# Patient Record
Sex: Female | Born: 1990 | Race: White | Hispanic: No | Marital: Single | Smoking: Never smoker
Health system: Southern US, Community
[De-identification: ages and names within clinical notes are randomized; demographics above are authoritative.]

## PROBLEM LIST (undated history)

## (undated) ENCOUNTER — Inpatient Hospital Stay: Payer: Self-pay

## (undated) DIAGNOSIS — N83209 Unspecified ovarian cyst, unspecified side: Secondary | ICD-10-CM

## (undated) DIAGNOSIS — F39 Unspecified mood [affective] disorder: Secondary | ICD-10-CM

## (undated) HISTORY — PX: TONSILLECTOMY: SUR1361

## (undated) HISTORY — PX: BREAST BIOPSY: SHX20

---
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
## 2008-07-28 ENCOUNTER — Emergency Department: Payer: Self-pay | Admitting: Emergency Medicine

## 2008-09-24 ENCOUNTER — Ambulatory Visit: Payer: Self-pay | Admitting: Otolaryngology

## 2008-09-30 ENCOUNTER — Ambulatory Visit: Payer: Self-pay | Admitting: Otolaryngology

## 2009-03-29 ENCOUNTER — Ambulatory Visit: Payer: Self-pay | Admitting: Family Medicine

## 2009-04-02 ENCOUNTER — Emergency Department: Payer: Self-pay | Admitting: Emergency Medicine

## 2009-06-04 ENCOUNTER — Emergency Department: Payer: Self-pay | Admitting: Internal Medicine

## 2009-09-07 ENCOUNTER — Emergency Department: Payer: Self-pay | Admitting: Emergency Medicine

## 2010-03-05 ENCOUNTER — Emergency Department: Payer: Self-pay | Admitting: Emergency Medicine

## 2010-08-02 ENCOUNTER — Emergency Department: Payer: Self-pay | Admitting: Emergency Medicine

## 2010-10-22 ENCOUNTER — Observation Stay: Payer: Self-pay | Admitting: Obstetrics and Gynecology

## 2010-11-01 ENCOUNTER — Observation Stay: Payer: Self-pay

## 2010-12-02 ENCOUNTER — Observation Stay: Payer: Self-pay | Admitting: Obstetrics & Gynecology

## 2010-12-27 ENCOUNTER — Inpatient Hospital Stay: Payer: Self-pay

## 2011-07-23 ENCOUNTER — Emergency Department: Payer: Self-pay | Admitting: Emergency Medicine

## 2011-07-23 LAB — URINALYSIS, COMPLETE
Glucose,UR: NEGATIVE mg/dL (ref 0–75)
Leukocyte Esterase: NEGATIVE
Nitrite: NEGATIVE
Protein: 30
Specific Gravity: 1.029 (ref 1.003–1.030)
WBC UR: 8 /HPF (ref 0–5)

## 2011-07-23 LAB — PREGNANCY, URINE: Pregnancy Test, Urine: NEGATIVE m[IU]/mL

## 2011-10-11 ENCOUNTER — Emergency Department: Payer: Self-pay

## 2012-01-29 ENCOUNTER — Emergency Department: Payer: Self-pay | Admitting: *Deleted

## 2012-01-29 LAB — CBC
HCT: 40.7 % (ref 35.0–47.0)
HGB: 14.4 g/dL (ref 12.0–16.0)
MCH: 28.9 pg (ref 26.0–34.0)
MCHC: 35.3 g/dL (ref 32.0–36.0)
MCV: 82 fL (ref 80–100)
RBC: 4.96 10*6/uL (ref 3.80–5.20)
RDW: 12.7 % (ref 11.5–14.5)

## 2012-01-29 LAB — PREGNANCY, URINE: Pregnancy Test, Urine: NEGATIVE m[IU]/mL

## 2012-05-21 ENCOUNTER — Emergency Department: Payer: Self-pay | Admitting: Unknown Physician Specialty

## 2012-08-14 ENCOUNTER — Emergency Department: Payer: Self-pay | Admitting: Internal Medicine

## 2012-09-06 ENCOUNTER — Emergency Department: Payer: Self-pay | Admitting: Emergency Medicine

## 2012-09-06 LAB — CBC
MCV: 83 fL (ref 80–100)
WBC: 9.1 10*3/uL (ref 3.6–11.0)

## 2012-09-06 LAB — COMPREHENSIVE METABOLIC PANEL
Alkaline Phosphatase: 39 U/L — ABNORMAL LOW (ref 50–136)
BUN: 10 mg/dL (ref 7–18)
Bilirubin,Total: 0.3 mg/dL (ref 0.2–1.0)
Calcium, Total: 8.9 mg/dL (ref 8.5–10.1)
Creatinine: 0.63 mg/dL (ref 0.60–1.30)
Glucose: 73 mg/dL (ref 65–99)
Osmolality: 277 (ref 275–301)
Sodium: 140 mmol/L (ref 136–145)
Total Protein: 7.1 g/dL (ref 6.4–8.2)

## 2012-09-06 LAB — URINALYSIS, COMPLETE
Bacteria: NONE SEEN
Bilirubin,UR: NEGATIVE
Blood: NEGATIVE
Ketone: NEGATIVE
Leukocyte Esterase: NEGATIVE
Ph: 6 (ref 4.5–8.0)
RBC,UR: 2 /HPF (ref 0–5)
Specific Gravity: 1.026 (ref 1.003–1.030)

## 2012-09-07 LAB — MONONUCLEOSIS SCREEN: Mono Test: NEGATIVE

## 2012-12-16 ENCOUNTER — Emergency Department: Payer: Self-pay | Admitting: Emergency Medicine

## 2013-09-07 ENCOUNTER — Emergency Department: Payer: Self-pay | Admitting: Emergency Medicine

## 2013-09-07 LAB — CBC WITH DIFFERENTIAL/PLATELET
Basophil #: 0.1 10*3/uL (ref 0.0–0.1)
Basophil %: 0.8 %
EOS ABS: 0.1 10*3/uL (ref 0.0–0.7)
Eosinophil %: 1.6 %
HCT: 40.6 % (ref 35.0–47.0)
HGB: 13.4 g/dL (ref 12.0–16.0)
Lymphocyte #: 3 10*3/uL (ref 1.0–3.6)
Lymphocyte %: 37 %
MCH: 28.5 pg (ref 26.0–34.0)
MCHC: 33.1 g/dL (ref 32.0–36.0)
MCV: 86 fL (ref 80–100)
MONO ABS: 0.6 x10 3/mm (ref 0.2–0.9)
MONOS PCT: 7.7 %
Neutrophil #: 4.3 10*3/uL (ref 1.4–6.5)
Neutrophil %: 52.9 %
Platelet: 322 10*3/uL (ref 150–440)
RBC: 4.72 10*6/uL (ref 3.80–5.20)
RDW: 12.9 % (ref 11.5–14.5)
WBC: 8.2 10*3/uL (ref 3.6–11.0)

## 2013-09-07 LAB — URINALYSIS, COMPLETE
BILIRUBIN, UR: NEGATIVE
Bacteria: NONE SEEN
Blood: NEGATIVE
Glucose,UR: NEGATIVE mg/dL (ref 0–75)
KETONE: NEGATIVE
Leukocyte Esterase: NEGATIVE
Nitrite: NEGATIVE
PH: 6 (ref 4.5–8.0)
Protein: NEGATIVE
RBC, UR: NONE SEEN /HPF (ref 0–5)
Specific Gravity: 1.019 (ref 1.003–1.030)
WBC UR: 1 /HPF (ref 0–5)

## 2013-09-07 LAB — COMPREHENSIVE METABOLIC PANEL
ALBUMIN: 4.1 g/dL (ref 3.4–5.0)
AST: 16 U/L (ref 15–37)
Alkaline Phosphatase: 38 U/L — ABNORMAL LOW
Anion Gap: 7 (ref 7–16)
BUN: 10 mg/dL (ref 7–18)
Bilirubin,Total: 0.4 mg/dL (ref 0.2–1.0)
CALCIUM: 9.2 mg/dL (ref 8.5–10.1)
CHLORIDE: 105 mmol/L (ref 98–107)
CO2: 25 mmol/L (ref 21–32)
Creatinine: 0.51 mg/dL — ABNORMAL LOW (ref 0.60–1.30)
EGFR (African American): >60
Glucose: 86 mg/dL (ref 65–99)
OSMOLALITY: 272 (ref 275–301)
Potassium: 3.3 mmol/L — ABNORMAL LOW (ref 3.5–5.1)
SGPT (ALT): 26 U/L (ref 12–78)
Sodium: 137 mmol/L (ref 136–145)
TOTAL PROTEIN: 7.4 g/dL (ref 6.4–8.2)

## 2013-09-07 LAB — LIPASE, BLOOD: Lipase: 373 U/L (ref 73–393)

## 2014-04-19 ENCOUNTER — Emergency Department: Payer: Self-pay | Admitting: Emergency Medicine

## 2014-04-19 LAB — COMPREHENSIVE METABOLIC PANEL
ALK PHOS: 32 U/L — AB
ALT: 21 U/L
ANION GAP: 8 (ref 7–16)
AST: 15 U/L (ref 15–37)
Albumin: 3.8 g/dL (ref 3.4–5.0)
BILIRUBIN TOTAL: 0.3 mg/dL (ref 0.2–1.0)
BUN: 8 mg/dL (ref 7–18)
CALCIUM: 8.4 mg/dL — AB (ref 8.5–10.1)
CHLORIDE: 105 mmol/L (ref 98–107)
CREATININE: 0.56 mg/dL — AB (ref 0.60–1.30)
Co2: 26 mmol/L (ref 21–32)
EGFR (African American): >60
EGFR (Non-African Amer.): >60
Glucose: 75 mg/dL (ref 65–99)
Osmolality: 275 (ref 275–301)
POTASSIUM: 3.4 mmol/L — AB (ref 3.5–5.1)
Sodium: 139 mmol/L (ref 136–145)
Total Protein: 6.7 g/dL (ref 6.4–8.2)

## 2014-04-19 LAB — HCG, QUANTITATIVE, PREGNANCY: BETA HCG, QUANT.: 1051 m[IU]/mL — AB

## 2014-04-19 LAB — URINALYSIS, COMPLETE
Bilirubin,UR: NEGATIVE
Glucose,UR: NEGATIVE mg/dL (ref 0–75)
Ketone: NEGATIVE
Nitrite: NEGATIVE
Ph: 5 (ref 4.5–8.0)
Protein: NEGATIVE
RBC,UR: 1 /HPF (ref 0–5)
Specific Gravity: 1.012 (ref 1.003–1.030)
Squamous Epithelial: 2
WBC UR: 7 /HPF (ref 0–5)

## 2014-04-19 LAB — CBC
HCT: 39 % (ref 35.0–47.0)
HGB: 13 g/dL (ref 12.0–16.0)
MCH: 28.7 pg (ref 26.0–34.0)
MCHC: 33.4 g/dL (ref 32.0–36.0)
MCV: 86 fL (ref 80–100)
Platelet: 289 10*3/uL (ref 150–440)
RBC: 4.53 10*6/uL (ref 3.80–5.20)
RDW: 12.6 % (ref 11.5–14.5)
WBC: 7 10*3/uL (ref 3.6–11.0)

## 2014-04-19 LAB — PREGNANCY, URINE: Pregnancy Test, Urine: POSITIVE m[IU]/mL

## 2014-06-02 ENCOUNTER — Emergency Department: Payer: Self-pay | Admitting: Student

## 2014-09-10 ENCOUNTER — Encounter: Payer: Self-pay | Admitting: Emergency Medicine

## 2014-09-10 DIAGNOSIS — N939 Abnormal uterine and vaginal bleeding, unspecified: Secondary | ICD-10-CM | POA: Diagnosis not present

## 2014-09-10 DIAGNOSIS — Z3202 Encounter for pregnancy test, result negative: Secondary | ICD-10-CM | POA: Diagnosis not present

## 2014-09-10 LAB — URINALYSIS COMPLETE WITH MICROSCOPIC (ARMC ONLY)
Bilirubin Urine: NEGATIVE
GLUCOSE, UA: NEGATIVE mg/dL
Ketones, ur: NEGATIVE mg/dL
Nitrite: NEGATIVE
PROTEIN: NEGATIVE mg/dL
SPECIFIC GRAVITY, URINE: 1.018 (ref 1.005–1.030)
pH: 6 (ref 5.0–8.0)

## 2014-09-10 LAB — BASIC METABOLIC PANEL
Anion gap: 6 (ref 5–15)
BUN: 9 mg/dL (ref 6–20)
CALCIUM: 9.1 mg/dL (ref 8.9–10.3)
CO2: 25 mmol/L (ref 22–32)
Chloride: 108 mmol/L (ref 101–111)
Creatinine, Ser: 0.64 mg/dL (ref 0.44–1.00)
GFR calc Af Amer: >60 mL/min (ref >60)
GFR calc non Af Amer: >60 mL/min (ref >60)
GLUCOSE: 90 mg/dL (ref 65–99)
POTASSIUM: 3.3 mmol/L — AB (ref 3.5–5.1)
Sodium: 139 mmol/L (ref 135–145)

## 2014-09-10 LAB — CBC
HEMATOCRIT: 38.2 % (ref 35.0–47.0)
HEMOGLOBIN: 12.9 g/dL (ref 12.0–16.0)
MCH: 28.4 pg (ref 26.0–34.0)
MCHC: 33.8 g/dL (ref 32.0–36.0)
MCV: 84 fL (ref 80.0–100.0)
PLATELETS: 316 10*3/uL (ref 150–440)
RBC: 4.55 MIL/uL (ref 3.80–5.20)
RDW: 12.7 % (ref 11.5–14.5)
WBC: 7.5 10*3/uL (ref 3.6–11.0)

## 2014-09-10 LAB — HCG, QUANTITATIVE, PREGNANCY: hCG, Beta Chain, Quant, S: 4 m[IU]/mL (ref <5)

## 2014-09-10 NOTE — ED Notes (Signed)
Pt reports that she took 2 home pregnancy test that were positive and reports that she is now having minimal vaginal bleeding.

## 2014-09-11 ENCOUNTER — Emergency Department
Admission: EM | Admit: 2014-09-11 | Discharge: 2014-09-11 | Disposition: A | Discharge status: Home / Self Care | Payer: Medicaid Other | Attending: Emergency Medicine | Admitting: Emergency Medicine

## 2014-09-11 ENCOUNTER — Encounter: Payer: Self-pay | Admitting: Emergency Medicine

## 2014-09-11 DIAGNOSIS — N939 Abnormal uterine and vaginal bleeding, unspecified: Secondary | ICD-10-CM

## 2014-09-11 NOTE — ED Provider Notes (Signed)
[facility] Emergency Department Provider Note    ____________________________________________  Time seen: 2:45 AM  I have reviewed the triage vital signs and the nursing notes.   HISTORY  Chief Complaint Vaginal Bleeding       HPI Julie ShieldsBrittany S Moss is a 24 y.o. female presents with vaginal spotting times one day. Of note patient stated that she took 2 home pregnancy tests both of which were positive. Patient has a history of 3 pregnancies 2 deliveries and one miscarriage in December.     History reviewed. No pertinent past medical history.  There are no active problems to display for this patient.   Past Surgical History  Procedure Laterality Date  . Tonsillectomy      No current outpatient prescriptions on file.  Allergies Sulfa antibiotics  History reviewed. No pertinent family history.  Social History History  Substance Use Topics  . Smoking status: Never Smoker   . Smokeless tobacco: Not on file  . Alcohol Use: No    Review of Systems  Constitutional: Negative for fever. Eyes: Negative for visual changes. ENT: Negative for sore throat. Cardiovascular: Negative for chest pain. Respiratory: Negative for shortness of breath. Gastrointestinal: Negative for abdominal pain, vomiting and diarrhea. Genitourinary: Negative for dysuria. Musculoskeletal: Negative for back pain. Skin: Negative for rash. Neurological: Negative for headaches, focal weakness or numbness.   10-point ROS otherwise negative.  ____________________________________________   PHYSICAL EXAM:  VITAL SIGNS: ED Triage Vitals  Enc Vitals Group     BP 09/10/14 2241 122/83 mmHg     Pulse Rate 09/10/14 2241 68     Resp 09/10/14 2241 16     Temp 09/10/14 2241 98.1 F (36.7 C)     Temp Source 09/10/14 2241 Oral     SpO2 09/10/14 2241 100 %     Weight 09/10/14 2241 125 lb (56.7 kg)     Height 09/10/14 2241 5\' 1"  (1.549 m)     Head Cir --      Peak  Flow --      Pain Score --      Pain Loc --      Pain Edu? --      Excl. in GC? --      Constitutional: Alert and oriented. Well appearing and in no distress. Eyes: Conjunctivae are normal. PERRL. Normal extraocular movements. ENT   Head: Normocephalic and atraumatic.   Nose: No congestion/rhinnorhea.   Mouth/Throat: Mucous membranes are moist.   Neck: No stridor. Hematological/Lymphatic/Immunilogical: No cervical lymphadenopathy. Cardiovascular: Normal rate, regular rhythm. Normal and symmetric distal pulses are present in all extremities. No murmurs, rubs, or gallops. Respiratory: Normal respiratory effort without tachypnea nor retractions. Breath sounds are clear and equal bilaterally. No wheezes/rales/rhonchi. Gastrointestinal: Soft and nontender. No distention. No abdominal bruits. There is no CVA tenderness. Genitourinary: deferred Musculoskeletal: Nontender with normal range of motion in all extremities. No joint effusions.  No lower extremity tenderness nor edema. Neurologic:  Normal speech and language. No gross focal neurologic deficits are appreciated. Speech is normal. No gait instability. Skin:  Skin is warm, dry and intact. No rash noted. Psychiatric: Mood and affect are normal. Speech and behavior are normal. Patient exhibits appropriate insight and judgment.  ____________________________________________    LABS (pertinent positives/negatives)  Labs Reviewed  URINALYSIS COMPLETEWITH MICROSCOPIC ([facility])  - Abnormal; Notable for the following:    Color, Urine YELLOW (*)    APPearance HAZY (*)    Hgb urine dipstick 3+ (*)    Leukocytes,  UA TRACE (*)    Bacteria, UA RARE (*)    Squamous Epithelial / LPF 0-5 (*)    All other components within normal limits  BASIC METABOLIC PANEL - Abnormal; Notable for the following:    Potassium 3.3 (*)    All other components within normal limits  CBC  HCG, QUANTITATIVE, PREGNANCY      ____________________________________________   EKG  None performed  ____________________________________________    RADIOLOGY  None performed  ____________________________________________    ____________________________________________   INITIAL IMPRESSION / ASSESSMENT AND PLAN / ED COURSE  Pertinent labs & imaging results that were available during my care of the patient were reviewed by me and considered in my medical decision making (see chart for details).  Given history and physical, hCG less than 5 patient at this time not pregnant. However patient referred to Dr. [facility]  ____________________________________________   FINAL CLINICAL IMPRESSION(S) / ED DIAGNOSES  Final diagnoses:  Vaginal bleeding     Darci Currentandolph N Brown, MD 09/11/14 [id number]

## 2014-09-11 NOTE — ED Notes (Signed)
Pt placed in ED 33. Asked pt to put on gown. Pt refuses, states, "I just want my lab results."

## 2014-09-11 NOTE — Discharge Instructions (Signed)
Abnormal Uterine Bleeding Abnormal uterine bleeding can affect women at various stages in life, including teenagers, women in their reproductive years, pregnant women, and women who have reached menopause. Several kinds of uterine bleeding are considered abnormal, including:  Bleeding or spotting between periods.   Bleeding after sexual intercourse.   Bleeding that is heavier or more than normal.   Periods that last longer than usual.  Bleeding after menopause.  Many cases of abnormal uterine bleeding are minor and simple to treat, while others are more serious. Any type of abnormal bleeding should be evaluated by your health care provider. Treatment will depend on the cause of the bleeding. HOME CARE INSTRUCTIONS Monitor your condition for any changes. The following actions may help to alleviate any discomfort you are experiencing:  Avoid the use of tampons and douches as directed by your health care provider.  Change your pads frequently. You should get regular pelvic exams and Pap tests. Keep all follow-up appointments for diagnostic tests as directed by your health care provider.  SEEK MEDICAL CARE IF:   Your bleeding lasts more than 1 week.   You feel dizzy at times.  SEEK IMMEDIATE MEDICAL CARE IF:   You pass out.   You are changing pads every 15 to 30 minutes.   You have abdominal pain.  You have a fever.   You become sweaty or weak.   You are passing large blood clots from the vagina.   You start to feel nauseous and vomit. MAKE SURE YOU:   Understand these instructions.  Will watch your condition.  Will get help right away if you are not doing well or get worse. Document Released: 04/23/2005 Document Revised: 04/28/2013 Document Reviewed: 11/20/2012 ExitCare Patient Information 2015 [employer]. This information is not intended to replace advice given to you by your health care provider. Make sure you discuss any questions you have with your  health care provider.  

## 2014-09-28 ENCOUNTER — Emergency Department
Admission: EM | Admit: 2014-09-28 | Discharge: 2014-09-28 | Disposition: A | Discharge status: Home / Self Care | Payer: Medicaid Other | Attending: Emergency Medicine | Admitting: Emergency Medicine

## 2014-09-28 ENCOUNTER — Encounter: Payer: Self-pay | Admitting: Emergency Medicine

## 2014-09-28 ENCOUNTER — Emergency Department: Payer: Medicaid Other

## 2014-09-28 DIAGNOSIS — R102 Pelvic and perineal pain: Secondary | ICD-10-CM | POA: Diagnosis not present

## 2014-09-28 DIAGNOSIS — Z3202 Encounter for pregnancy test, result negative: Secondary | ICD-10-CM | POA: Diagnosis not present

## 2014-09-28 HISTORY — DX: Unspecified ovarian cyst, unspecified side: N83.209

## 2014-09-28 LAB — CHLAMYDIA/NGC RT PCR (ARMC ONLY)
CHLAMYDIA TR: NOT DETECTED
N gonorrhoeae: NOT DETECTED

## 2014-09-28 LAB — URINALYSIS COMPLETE WITH MICROSCOPIC (ARMC ONLY)
BILIRUBIN URINE: NEGATIVE
Bacteria, UA: NONE SEEN
Glucose, UA: NEGATIVE mg/dL
Hgb urine dipstick: NEGATIVE
Ketones, ur: NEGATIVE mg/dL
Leukocytes, UA: NEGATIVE
Nitrite: NEGATIVE
Protein, ur: NEGATIVE mg/dL
Specific Gravity, Urine: 1.008 (ref 1.005–1.030)
pH: 7 (ref 5.0–8.0)

## 2014-09-28 LAB — CBC WITH DIFFERENTIAL/PLATELET
Basophils Absolute: 0.1 10*3/uL (ref 0–0.1)
Basophils Relative: 1 %
Eosinophils Absolute: 0.1 10*3/uL (ref 0–0.7)
Eosinophils Relative: 2 %
HCT: 38.6 % (ref 35.0–47.0)
Hemoglobin: 13.4 g/dL (ref 12.0–16.0)
Lymphocytes Relative: 31 %
Lymphs Abs: 2.3 10*3/uL (ref 1.0–3.6)
MCH: 28.9 pg (ref 26.0–34.0)
MCHC: 34.7 g/dL (ref 32.0–36.0)
MCV: 83.3 fL (ref 80.0–100.0)
MONOS PCT: 6 %
Monocytes Absolute: 0.5 10*3/uL (ref 0.2–0.9)
NEUTROS PCT: 60 %
Neutro Abs: 4.5 10*3/uL (ref 1.4–6.5)
Platelets: 287 10*3/uL (ref 150–440)
RBC: 4.63 MIL/uL (ref 3.80–5.20)
RDW: 12.7 % (ref 11.5–14.5)
WBC: 7.4 10*3/uL (ref 3.6–11.0)

## 2014-09-28 LAB — BASIC METABOLIC PANEL
Anion gap: 6 (ref 5–15)
BUN: 7 mg/dL (ref 6–20)
CO2: 25 mmol/L (ref 22–32)
Calcium: 8.4 mg/dL — ABNORMAL LOW (ref 8.9–10.3)
Chloride: 105 mmol/L (ref 101–111)
Creatinine, Ser: 0.56 mg/dL (ref 0.44–1.00)
GFR calc Af Amer: >60 mL/min (ref >60)
Glucose, Bld: 87 mg/dL (ref 65–99)
Potassium: 3.5 mmol/L (ref 3.5–5.1)
Sodium: 136 mmol/L (ref 135–145)

## 2014-09-28 LAB — WET PREP, GENITAL
Clue Cells Wet Prep HPF POC: NONE SEEN
Trich, Wet Prep: NONE SEEN
Yeast Wet Prep HPF POC: NONE SEEN

## 2014-09-28 LAB — HCG, QUANTITATIVE, PREGNANCY

## 2014-09-28 LAB — LIPASE, BLOOD: LIPASE: 39 U/L (ref 22–51)

## 2014-09-28 MED ORDER — AZITHROMYCIN 250 MG PO TABS
ORAL_TABLET | ORAL | Status: DC
Start: 2014-09-28 — End: 2014-09-28
  Filled 2014-09-28: qty 4

## 2014-09-28 MED ORDER — AZITHROMYCIN 1 G PO PACK: 1.0000 g | PACK | Freq: Once | ORAL | Status: DC

## 2014-09-28 MED ORDER — AZITHROMYCIN 250 MG PO TABS
1000.0000 mg | ORAL_TABLET | Freq: Once | ORAL | Status: AC
  Administered 2014-09-28: 1000 mg via ORAL

## 2014-09-28 MED ORDER — LIDOCAINE HCL (PF) 1 % IJ SOLN
INTRAMUSCULAR | Status: AC
  Administered 2014-09-28: 0.8 mL via INTRAMUSCULAR
  Filled 2014-09-28: qty 5

## 2014-09-28 MED ORDER — CEFTRIAXONE SODIUM 250 MG IJ SOLR
250.0000 mg | Freq: Once | INTRAMUSCULAR | Status: AC
  Administered 2014-09-28: 250 mg via INTRAMUSCULAR

## 2014-09-28 MED ORDER — TRAMADOL HCL 50 MG PO TABS: 50.0000 mg | ORAL_TABLET | Freq: Four times a day (QID) | ORAL | Status: AC | PRN

## 2014-09-28 MED ORDER — CEFTRIAXONE SODIUM 250 MG IJ SOLR
INTRAMUSCULAR | Status: AC
  Filled 2014-09-28: qty 250

## 2014-09-28 NOTE — ED Provider Notes (Signed)
[facility] Emergency Department Provider Note  Time seen: 2:51 PM  I have reviewed the triage vital signs and the nursing notes.   HISTORY  Chief Complaint Abdominal Pain    HPI Julie Moss is a 24 y.o. female with no known past medical history who presents to the emergency department with right pelvic pain and vaginal discharge. According to the patient she has a new sexual partner for the last several weeks has been having vaginal discharge. She also notes 3 days of right-sided pelvic pain. States normal period approximately 2 weeks ago. Denies any fever, nausea/vomiting/diarrhea/dysuria. Describes her pelvic pain is moderate, right side of the pelvis, no radiation, no identified modifying factors.     Past Medical History  Diagnosis Date  . Ovarian cyst     There are no active problems to display for this patient.   Past Surgical History  Procedure Laterality Date  . Tonsillectomy      No current outpatient prescriptions on file.  Allergies Sulfa antibiotics  History reviewed. No pertinent family history.  Social History History  Substance Use Topics  . Smoking status: Never Smoker   . Smokeless tobacco: Not on file  . Alcohol Use: Yes    Review of Systems Constitutional: Negative for fever. Cardiovascular: Negative for chest pain. Respiratory: Negative for shortness of breath. Gastrointestinal: Positive for right pelvic pain. Genitourinary: Negative for dysuria. Positive for vaginal discharge. Musculoskeletal: Negative for back pain.  10-point ROS otherwise negative.  ____________________________________________   PHYSICAL EXAM:  VITAL SIGNS: ED Triage Vitals  Enc Vitals Group     BP 09/28/14 1134 126/74 mmHg     Pulse Rate 09/28/14 1134 64     Resp 09/28/14 1134 16     Temp 09/28/14 1134 98.1 F (36.7 C)     Temp src --      SpO2 09/28/14 1134 100 %     Weight 09/28/14 1134 125 lb (56.7 kg)     Height  09/28/14 1134  (1.549 m)     Head Cir --      Peak Flow --      Pain Score 09/28/14 1135 9     Pain Loc --      Pain Edu? --      Excl. in GC? --     Constitutional: Alert and oriented. Well appearing and in no distress. ENT   Mouth/Throat: Mucous membranes are moist. Cardiovascular: Normal rate, regular rhythm. No murmurs Respiratory: Normal respiratory effort without tachypnea nor retractions. Breath sounds are clear  Gastrointestinal: Soft, mild right lower abdomen/right pelvis pain. No rebound or guarding. Nontender over McBurney's point. GU: Moderate right adnexal tenderness palpation, mild/moderate discharge. No cervical motion tenderness. Musculoskeletal: Nontender with normal range of motion in all extremities.  Neurologic:  Normal speech and language. No gross focal neurologic deficits  Skin:  Skin is warm, dry and intact.  Psychiatric: Mood and affect are normal. Speech and behavior are normal. ____________________________________________     RADIOLOGY  Ultrasound largely within normal limits.  ____________________________________________    INITIAL IMPRESSION / ASSESSMENT AND PLAN / ED COURSE  Pertinent labs & imaging results that were available during my care of the patient were reviewed by me and considered in my medical decision making (see chart for details).  Patient with right pelvic pain 3 days with vaginal discharge. States new sexual partner. We will treat for STDs, and sent for an ultrasound. Patient agreeable to plan.  ----------------------------------------- 4:44 PM on  09/28/2014 -----------------------------------------  Ultrasound within normal limits, labs within normal limits. We will discharge home with OB/GYN follow-up. Patient agreeable to plan. We'll place patient on short course of Ultram.  ____________________________________________   FINAL CLINICAL IMPRESSION(S) / ED DIAGNOSES  Right-sided pelvic pain Vaginal  discharge   Minna AntisKevin Michele Kerlin, MD 09/28/14 1645

## 2014-09-28 NOTE — ED Notes (Signed)
Says pain in lower abd for aobut 1 week.  Says tender on right.  Says no history of ovarian cysts, but she has had fibroids found on ultrasound in the past.

## 2014-09-28 NOTE — Discharge Instructions (Signed)
Abdominal Pain, Women °Abdominal (stomach, pelvic, or belly) pain can be caused by many things. It is important to tell your doctor: °· The location of the pain. °· Does it come and go or is it present all the time? °· Are there things that start the pain (eating certain foods, exercise)? °· Are there other symptoms associated with the pain (fever, nausea, vomiting, diarrhea)? °All of this is helpful to know when trying to find the cause of the pain. °CAUSES  °· Stomach: virus or bacteria infection, or ulcer. °· Intestine: appendicitis (inflamed appendix), regional ileitis (Crohn's disease), ulcerative colitis (inflamed colon), irritable bowel syndrome, diverticulitis (inflamed diverticulum of the colon), or cancer of the stomach or intestine. °· Gallbladder disease or stones in the gallbladder. °· Kidney disease, kidney stones, or infection. °· Pancreas infection or cancer. °· Fibromyalgia (pain disorder). °· Diseases of the female organs: °¨ Uterus: fibroid (non-cancerous) tumors or infection. °¨ Fallopian tubes: infection or tubal pregnancy. °¨ Ovary: cysts or tumors. °¨ Pelvic adhesions (scar tissue). °¨ Endometriosis (uterus lining tissue growing in the pelvis and on the pelvic organs). °¨ Pelvic congestion syndrome (female organs filling up with blood just before the menstrual period). °¨ Pain with the menstrual period. °¨ Pain with ovulation (producing an egg). °¨ Pain with an IUD (intrauterine device, birth control) in the uterus. °¨ Cancer of the female organs. °· Functional pain (pain not caused by a disease, may improve without treatment). °· Psychological pain. °· Depression. °DIAGNOSIS  °Your doctor will decide the seriousness of your pain by doing an examination. °· Blood tests. °· X-rays. °· Ultrasound. °· CT scan (computed tomography, special type of X-ray). °· MRI (magnetic resonance imaging). °· Cultures, for infection. °· Barium enema (dye inserted in the large intestine, to better view it with  X-rays). °· Colonoscopy (looking in intestine with a lighted tube). °· Laparoscopy (minor surgery, looking in abdomen with a lighted tube). °· Major abdominal exploratory surgery (looking in abdomen with a large incision). °TREATMENT  °The treatment will depend on the cause of the pain.  °· Many cases can be observed and treated at home. °· Over-the-counter medicines recommended by your caregiver. °· Prescription medicine. °· Antibiotics, for infection. °· Birth control pills, for painful periods or for ovulation pain. °· Hormone treatment, for endometriosis. °· Nerve blocking injections. °· Physical therapy. °· Antidepressants. °· Counseling with a psychologist or psychiatrist. °· Minor or major surgery. °HOME CARE INSTRUCTIONS  °· Do not take laxatives, unless directed by your caregiver. °· Take over-the-counter pain medicine only if ordered by your caregiver. Do not take aspirin because it can cause an upset stomach or bleeding. °· Try a clear liquid diet (broth or water) as ordered by your caregiver. Slowly move to a bland diet, as tolerated, if the pain is related to the stomach or intestine. °· Have a thermometer and take your temperature several times a day, and record it. °· Bed rest and sleep, if it helps the pain. °· Avoid sexual intercourse, if it causes pain. °· Avoid stressful situations. °· Keep your follow-up appointments and tests, as your caregiver orders. °· If the pain does not go away with medicine or surgery, you may try: °¨ Acupuncture. °¨ Relaxation exercises (yoga, meditation). °¨ Group therapy. °¨ Counseling. °SEEK MEDICAL CARE IF:  °· You notice certain foods cause stomach pain. °· Your home care treatment is not helping your pain. °· You need stronger pain medicine. °· You want your IUD removed. °· You feel faint or   lightheaded. °· You develop nausea and vomiting. °· You develop a rash. °· You are having side effects or an allergy to your medicine. °SEEK IMMEDIATE MEDICAL CARE IF:  °· Your  pain does not go away or gets worse. °· You have a fever. °· Your pain is felt only in portions of the abdomen. The right side could possibly be appendicitis. The left lower portion of the abdomen could be colitis or diverticulitis. °· You are passing blood in your stools (bright red or black tarry stools, with or without vomiting). °· You have blood in your urine. °· You develop chills, with or without a fever. °· You pass out. °MAKE SURE YOU:  °· Understand these instructions. °· Will watch your condition. °· Will get help right away if you are not doing well or get worse. °Document Released: 02/18/2007 Document Revised: 09/07/2013 Document Reviewed: 03/10/2009 °ExitCare® Patient Information ©2015 [employer]. This information is not intended to replace advice given to you by your health care provider. Make sure you discuss any questions you have with your health care provider. ° °

## 2014-09-28 NOTE — ED Notes (Signed)
Reports lower abd pain , states "it feels like my right ovary".  Pt eating chicfilla, NAD

## 2015-02-01 ENCOUNTER — Emergency Department
Admission: EM | Admit: 2015-02-01 | Discharge: 2015-02-01 | Disposition: A | Discharge status: Home / Self Care | Payer: Private Health Insurance - Indemnity | Attending: Emergency Medicine | Admitting: Emergency Medicine

## 2015-02-01 DIAGNOSIS — F329 Major depressive disorder, single episode, unspecified: Secondary | ICD-10-CM | POA: Diagnosis not present

## 2015-02-01 DIAGNOSIS — Z3202 Encounter for pregnancy test, result negative: Secondary | ICD-10-CM | POA: Diagnosis not present

## 2015-02-01 DIAGNOSIS — Z79899 Other long term (current) drug therapy: Secondary | ICD-10-CM | POA: Insufficient documentation

## 2015-02-01 DIAGNOSIS — F32A Depression, unspecified: Secondary | ICD-10-CM

## 2015-02-01 LAB — COMPREHENSIVE METABOLIC PANEL
ALT: 16 U/L (ref 14–54)
AST: 23 U/L (ref 15–41)
Albumin: 4.5 g/dL (ref 3.5–5.0)
Alkaline Phosphatase: 32 U/L — ABNORMAL LOW (ref 38–126)
Anion gap: 8 (ref 5–15)
BUN: 9 mg/dL (ref 6–20)
CALCIUM: 9 mg/dL (ref 8.9–10.3)
CO2: 23 mmol/L (ref 22–32)
Chloride: 103 mmol/L (ref 101–111)
Creatinine, Ser: 0.7 mg/dL (ref 0.44–1.00)
Glucose, Bld: 73 mg/dL (ref 65–99)
Potassium: 3.4 mmol/L — ABNORMAL LOW (ref 3.5–5.1)
Sodium: 134 mmol/L — ABNORMAL LOW (ref 135–145)
Total Bilirubin: 0.4 mg/dL (ref 0.3–1.2)
Total Protein: 7.3 g/dL (ref 6.5–8.1)

## 2015-02-01 LAB — URINE DRUG SCREEN, QUALITATIVE (ARMC ONLY)
Amphetamines, Ur Screen: NOT DETECTED
BARBITURATES, UR SCREEN: NOT DETECTED
Benzodiazepine, Ur Scrn: NOT DETECTED
Cannabinoid 50 Ng, Ur ~~LOC~~: NOT DETECTED
Cocaine Metabolite,Ur ~~LOC~~: NOT DETECTED
MDMA (ECSTASY) UR SCREEN: NOT DETECTED
Methadone Scn, Ur: NOT DETECTED
Opiate, Ur Screen: NOT DETECTED
Phencyclidine (PCP) Ur S: NOT DETECTED
TRICYCLIC, UR SCREEN: NOT DETECTED

## 2015-02-01 LAB — CBC
HCT: 39.8 % (ref 35.0–47.0)
HEMOGLOBIN: 13.5 g/dL (ref 12.0–16.0)
MCH: 28.2 pg (ref 26.0–34.0)
MCHC: 33.8 g/dL (ref 32.0–36.0)
MCV: 83.4 fL (ref 80.0–100.0)
Platelets: 311 10*3/uL (ref 150–440)
RBC: 4.78 MIL/uL (ref 3.80–5.20)
RDW: 12.6 % (ref 11.5–14.5)
WBC: 8.2 10*3/uL (ref 3.6–11.0)

## 2015-02-01 LAB — POCT PREGNANCY, URINE: Preg Test, Ur: NEGATIVE

## 2015-02-01 LAB — ETHANOL

## 2015-02-01 LAB — TSH: TSH: 2.768 u[IU]/mL (ref 0.350–4.500)

## 2015-02-01 LAB — SALICYLATE LEVEL

## 2015-02-01 LAB — ACETAMINOPHEN LEVEL: Acetaminophen (Tylenol), Serum: <10 ug/mL — ABNORMAL LOW (ref 10–30)

## 2015-02-01 MED ORDER — ESCITALOPRAM OXALATE 20 MG PO TABS: 20.0000 mg | ORAL_TABLET | Freq: Every day | ORAL | Status: DC

## 2015-02-01 NOTE — ED Provider Notes (Signed)
Time Seen: Approximately 1730 I have reviewed the triage notes  Chief Complaint: Depression   History of Present Illness: Julie Moss is a 24 y.o. female who presents with feelings of depression. Patient states that she's had a previous history of diagnosed bipolar disease and was on Geodon, Seroquel, and Lexapro. She states she had severe fax from the Geodon and eventually weaned herself off the medication and this was several years ago. He states she's been having cage-type symptoms from her depression with difficulty with concentration, affect, sleep she denies any difficulty with her appetite. She states she feels depressed without feelings of significant anxiety. She denies any suicidal thoughts, homicidal thoughts, hallucinations. She denies taking any over-the-counter or illicit substances at this time. She states she tried to call multiple psychiatrist today without success and establishing appointment. She states she came here because she thought that we could provide somebody that she "" couldn't talk to "". She denies any physical complaints at this time. She denies weight loss or night sweats.   Past Medical History  Diagnosis Date  . Ovarian cyst     There are no active problems to display for this patient.   Past Surgical History  Procedure Laterality Date  . Tonsillectomy      Past Surgical History  Procedure Laterality Date  . Tonsillectomy      Current Outpatient Rx  Name  Route  Sig  Dispense  Refill  . escitalopram (LEXAPRO) 20 MG tablet   Oral   Take 1 tablet (20 mg total) by mouth daily.   30 tablet   0   . traMADol (ULTRAM) 50 MG tablet   Oral   Take 1 tablet (50 mg total) by mouth every 6 (six) hours as needed.   20 tablet   0     Allergies:  Sulfa antibiotics  Family History: No family history on file.  Social History: Social History  Substance Use Topics  . Smoking status: Never Smoker   . Smokeless tobacco: None  . Alcohol Use:  Yes     Review of Systems:   10 point review of systems was performed and was otherwise negative:  Constitutional: No fever Eyes: No visual disturbances ENT: No sore throat, ear pain Cardiac: No chest pain Respiratory: No shortness of breath, wheezing, or stridor Abdomen: No abdominal pain, no vomiting, No diarrhea Endocrine: No weight loss, No night sweats Extremities: No peripheral edema, cyanosis Skin: No rashes, easy bruising Neurologic: No focal weakness, trouble with speech or swollowing Urologic: No dysuria, Hematuria, or urinary frequency   Physical Exam:  ED Triage Vitals  Enc Vitals Group     BP 02/01/15 1643 122/83 mmHg     Pulse Rate 02/01/15 1643 71     Resp 02/01/15 1643 18     Temp 02/01/15 1643 98.4 F (36.9 C)     Temp Source 02/01/15 1643 Oral     SpO2 02/01/15 1643 100 %     Weight 02/01/15 1648 125 lb (56.7 kg)     Height 02/01/15 1648  (1.575 m)     Head Cir --      Peak Flow --      Pain Score --      Pain Loc --      Pain Edu? --      Excl. in GC? --     General: Awake , Alert , and Oriented times 3; GCS 15 Head: Normal cephalic , atraumatic Eyes: Pupils equal ,  round, reactive to light Nose/Throat: No nasal drainage, patent upper airway without erythema or exudate.  Neck: Supple, Full range of motion, No anterior adenopathy or palpable thyroid masses Lungs: Clear to ascultation without wheezes , rhonchi, or rales Heart: Regular rate, regular rhythm without murmurs , gallops , or rubs Abdomen: Soft, non tender without rebound, guarding , or rigidity; bowel sounds positive and symmetric in all 4 quadrants. No organomegaly .        Extremities: 2 plus symmetric pulses. No edema, clubbing or cyanosis Neurologic: normal ambulation, Motor symmetric without deficits, sensory intact Skin: warm, dry, no rashes   Labs:   All laboratory work was reviewed including any pertinent negatives or positives listed below:  Labs Reviewed   COMPREHENSIVE METABOLIC PANEL - Abnormal; Notable for the following:    Sodium 134 (*)    Potassium 3.4 (*)    Alkaline Phosphatase 32 (*)    All other components within normal limits  ACETAMINOPHEN LEVEL - Abnormal; Notable for the following:    Acetaminophen (Tylenol), Serum <10 (*)    All other components within normal limits  ETHANOL  SALICYLATE LEVEL  CBC  URINE DRUG SCREEN, QUALITATIVE ([facility] ONLY)  TSH  POC URINE PREG, ED   laboratory work was reviewed with no significant abnormalities    ED Course:   the patient's stay here was uneventful. She was given the option for a psychiatry consultation and/or a prescription for Lexapro with follow-up tomorrow at our [facility]. 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 She was given a work note so that she'll be able to call and establish an appointment and also walk and as needed at our [facility]. The patient states that she doesn't feel that she would ever herself because she is a mother. She states she does have a adequate support network at home and felt comfortable with discharge on Lexapro which is been on before in the past. He was advised that the medication can make her drowsy and will take up to 10 days to have some affect. Patient states that she is aware and she can always return here to the emergency department if she has any suicidal thoughts, homicidal thoughts, hallucinations.    Assessment: Depression   Final Clinical Impression: Final diagnoses:  Depression     Plan:  Outpatient management Patient was advised to return immediately if condition worsens. Patient was advised to follow up with her primary care physician or other specialized physicians involved and in their current assessment.             Jennye Moccasin, MD 02/01/15 [phone]

## 2015-02-01 NOTE — ED Notes (Signed)

## 2015-02-01 NOTE — ED Notes (Signed)

## 2015-02-01 NOTE — ED Notes (Signed)
Pt observed lying in bed  Pt observed with no unusual behavior  Appropriate to stimulation  No verbalized needs or concerns at this time  NAD assessed  Continue to monitor 

## 2015-02-01 NOTE — ED Notes (Signed)
Pt came her stating she needs to talk to someone, states she is feeling depressed and called multiple places but they were not able to see her..denies SI/HI.Marland Kitchenstates "I just need to talk to someone".Marland Kitchen

## 2015-02-01 NOTE — Discharge Instructions (Signed)
Depression °Depression refers to feeling sad, low, down in the dumps, blue, gloomy, or empty. In general, there are two kinds of depression: °1. Normal sadness or normal grief. This kind of depression is one that we all feel from time to time after upsetting life experiences, such as the loss of a job or the ending of a relationship. This kind of depression is considered normal, is short lived, and resolves within a few days to 2 weeks. Depression experienced after the loss of a loved one (bereavement) often lasts longer than 2 weeks but normally gets better with time. °2. Clinical depression. This kind of depression lasts longer than normal sadness or normal grief or interferes with your ability to function at home, at work, and in school. It also interferes with your personal relationships. It affects almost every aspect of your life. Clinical depression is an illness. °Symptoms of depression can also be caused by conditions other than those mentioned above, such as: °· Physical illness. Some physical illnesses, including underactive thyroid gland (hypothyroidism), severe anemia, specific types of cancer, diabetes, uncontrolled seizures, heart and lung problems, strokes, and chronic pain are commonly associated with symptoms of depression. °· Side effects of some prescription medicine. In some people, certain types of medicine can cause symptoms of depression. °· Substance abuse. Abuse of alcohol and illicit drugs can cause symptoms of depression. °SYMPTOMS °Symptoms of normal sadness and normal grief include the following: °· Feeling sad or crying for short periods of time. °· Not caring about anything (apathy). °· Difficulty sleeping or sleeping too much. °· No longer able to enjoy the things you used to enjoy. °· Desire to be by oneself all the time (social isolation). °· Lack of energy or motivation. °· Difficulty concentrating or remembering. °· Change in appetite or weight. °· Restlessness or  agitation. °Symptoms of clinical depression include the same symptoms of normal sadness or normal grief and also the following symptoms: °· Feeling sad or crying all the time. °· Feelings of guilt or worthlessness. °· Feelings of hopelessness or helplessness. °· Thoughts of suicide or the desire to harm yourself (suicidal ideation). °· Loss of touch with reality (psychotic symptoms). Seeing or hearing things that are not real (hallucinations) or having false beliefs about your life or the people around you (delusions and paranoia). °DIAGNOSIS  °The diagnosis of clinical depression is usually based on how bad the symptoms are and how long they have lasted. Your health care provider will also ask you questions about your medical history and substance use to find out if physical illness, use of prescription medicine, or substance abuse is causing your depression. Your health care provider may also order blood tests. °TREATMENT  °Often, normal sadness and normal grief do not require treatment. However, sometimes antidepressant medicine is given for bereavement to ease the depressive symptoms until they resolve. °The treatment for clinical depression depends on how bad the symptoms are but often includes antidepressant medicine, counseling with a mental health professional, or both. Your health care provider will help to determine what treatment is best for you. °Depression caused by physical illness usually goes away with appropriate medical treatment of the illness. If prescription medicine is causing depression, talk with your health care provider about stopping the medicine, decreasing the dose, or changing to another medicine. °Depression caused by the abuse of alcohol or illicit drugs goes away when you stop using these substances. Some adults need professional help in order to stop drinking or using drugs. °SEEK IMMEDIATE MEDICAL   CARE IF:  You have thoughts about hurting yourself or others.  You lose touch  with reality (have psychotic symptoms).  You are taking medicine for depression and have a serious side effect. FOR MORE INFORMATION  National Alliance on Mental Illness: www.nami.[employer] of Mental Health: http://www.maynard.net/ Document Released: 04/20/2000 Document Revised: 09/07/2013 Document Reviewed: 07/23/2011 [facility] Patient Information 2015 [place], [place]. This information is not intended to replace advice given to you by your health care provider. Make sure you discuss any questions you have with your health care provider.  Please return immediately if condition worsens. Please contact her primary physician or the physician you were given for referral. If you have any specialist physicians involved in her treatment and plan please also contact them. Thank you for using [facility].

## 2015-02-01 NOTE — ED Notes (Signed)
BEHAVIORAL HEALTH ROUNDING Patient sleeping: No. Patient alert and oriented: yes Behavior appropriate: Yes.  ; If no, describe:  Nutrition and fluids offered: yes Toileting and hygiene offered: Yes  Sitter present: q15 minute observations and security camera monitoring Law enforcement present: Yes  ODS  

## 2015-03-03 ENCOUNTER — Emergency Department
Admission: EM | Admit: 2015-03-03 | Discharge: 2015-03-03 | Disposition: A | Discharge status: Home / Self Care | Payer: Medicaid Other | Attending: Emergency Medicine | Admitting: Emergency Medicine

## 2015-03-03 ENCOUNTER — Encounter: Payer: Self-pay | Admitting: *Deleted

## 2015-03-03 DIAGNOSIS — Z79899 Other long term (current) drug therapy: Secondary | ICD-10-CM | POA: Insufficient documentation

## 2015-03-03 DIAGNOSIS — Z760 Encounter for issue of repeat prescription: Secondary | ICD-10-CM | POA: Diagnosis not present

## 2015-03-03 MED ORDER — ESCITALOPRAM OXALATE 20 MG PO TABS: 20.0000 mg | ORAL_TABLET | Freq: Every day | ORAL | Status: DC

## 2015-03-03 NOTE — ED Notes (Signed)
Pt requesting a medication refill of lexapro.  Last dose was yesterday.

## 2015-03-03 NOTE — ED Provider Notes (Signed)
[facility] Emergency Department Provider Note  ____________________________________________  Time seen: Approximately 5:29 PM  I have reviewed the triage vital signs and the nursing notes.   HISTORY  Chief Complaint Medication Refill    HPI Lynnda ShieldsBrittany S Cubero is a 24 y.o. female who presents emergency department for refill of her depression medication. She was seen here a month ago for depression and was started on Lexapro. She was given a referral to follow up with [facility] but was unable to be seen due to an insurance problem. She states that she has now made an appointment with a psychiatrist but that appointment is not until November 20. She presents here for refill of medication. She states that the medication is working to good effect. She denies any complaints at this time.   Past Medical History  Diagnosis Date  . Ovarian cyst     There are no active problems to display for this patient.   Past Surgical History  Procedure Laterality Date  . Tonsillectomy      Current Outpatient Rx  Name  Route  Sig  Dispense  Refill  . escitalopram (LEXAPRO) 20 MG tablet   Oral   Take 1 tablet (20 mg total) by mouth daily.   30 tablet   0   . traMADol (ULTRAM) 50 MG tablet   Oral   Take 1 tablet (50 mg total) by mouth every 6 (six) hours as needed.   20 tablet   0     Allergies Sulfa antibiotics  No family history on file.  Social History Social History  Substance Use Topics  . Smoking status: Never Smoker   . Smokeless tobacco: None  . Alcohol Use: No    Review of Systems Constitutional: No fever/chills. Endorses mild depression Eyes: No visual changes. ENT: No sore throat. Cardiovascular: Denies chest pain. Respiratory: Denies shortness of breath. Gastrointestinal: No abdominal pain.  No nausea, no vomiting.  No diarrhea.  No constipation. Genitourinary: Negative for dysuria. Musculoskeletal: Negative for back pain. Skin: Negative for  rash. Neurological: Negative for headaches, focal weakness or numbness.  10-point ROS otherwise negative.  ____________________________________________   PHYSICAL EXAM:  VITAL SIGNS: ED Triage Vitals  Enc Vitals Group     BP 03/03/15 1659 116/68 mmHg     Pulse Rate 03/03/15 1659 75     Resp 03/03/15 1659 20     Temp 03/03/15 1659 98.5 F (36.9 C)     Temp Source 03/03/15 1659 Oral     SpO2 03/03/15 1659 100 %     Weight 03/03/15 1659 128 lb (58.06 kg)     Height 03/03/15 1659 5\' 1"  (1.549 m)     Head Cir --      Peak Flow --      Pain Score --      Pain Loc --      Pain Edu? --      Excl. in GC? --     Constitutional: Alert and oriented. Well appearing and in no acute distress. Eyes: Conjunctivae are normal. PERRL. EOMI. Head: Atraumatic. Nose: No congestion/rhinnorhea. Mouth/Throat: Mucous membranes are moist.  Oropharynx non-erythematous. Neck: No stridor.   Cardiovascular: Normal rate, regular rhythm. Grossly normal heart sounds.  Good peripheral circulation. Respiratory: Normal respiratory effort.  No retractions. Lungs CTAB. Gastrointestinal: Soft and nontender. No distention. No abdominal bruits. No CVA tenderness. Musculoskeletal: No lower extremity tenderness nor edema.  No joint effusions. Neurologic:  Normal speech and language. No gross focal neurologic  deficits are appreciated. No gait instability. Skin:  Skin is warm, dry and intact. No rash noted. Psychiatric: Mood and affect are normal. Speech and behavior are normal.  ____________________________________________   LABS (all labs ordered are listed, but only abnormal results are displayed)  Labs Reviewed - No data to display ____________________________________________  EKG   ____________________________________________  RADIOLOGY   ____________________________________________   PROCEDURES  Procedure(s) performed: None  Critical Care performed:  No  ____________________________________________   INITIAL IMPRESSION / ASSESSMENT AND PLAN / ED COURSE  Pertinent labs & imaging results that were available during my care of the patient were reviewed by me and considered in my medical decision making (see chart for details).  The patient is a 24 year old female who presents emergency department for refill of her Lexapro that was started here a month ago. She has had a hard time obtaining access for follow-up but hasn't appointment on November 22. I will refill the patient's medication and she will keep the appointment on the 22nd. Patient verbalizes understanding of this and verbalizes compliance of this ____________________________________________   FINAL CLINICAL IMPRESSION(S) / ED DIAGNOSES  Final diagnoses:  Encounter for medication refill      Racheal Patches, PA-C 03/03/15 1737  Loleta Rose, MD 03/03/15 2358

## 2015-03-03 NOTE — ED Notes (Signed)
States she was seen last month and rx'd lexapro  Has appt. In nov and would like to get a refill on lexapro.denies any other sxs'

## 2015-03-03 NOTE — Discharge Instructions (Signed)
Medicine Refill at the Emergency Department  We have refilled your medicine today, but it is best for you to get refills through your primary health care provider's office. In the future, please plan ahead so you do not need to get refills from the emergency department.  If the medicine we refilled was a maintenance medicine, you may have received only enough to get you by until you are able to see your regular health care provider.     This information is not intended to replace advice given to you by your health care provider. Make sure you discuss any questions you have with your health care provider.     Document Released: 08/10/2003 Document Revised: 05/14/2014 Document Reviewed: 07/31/2013  Elsevier Interactive Patient Education 2016 Elsevier Inc.

## 2015-05-06 ENCOUNTER — Encounter: Payer: Self-pay | Admitting: Emergency Medicine

## 2015-05-06 ENCOUNTER — Emergency Department
Admission: EM | Admit: 2015-05-06 | Discharge: 2015-05-06 | Disposition: A | Discharge status: Home / Self Care | Payer: Private Health Insurance - Indemnity | Attending: Emergency Medicine | Admitting: Emergency Medicine

## 2015-05-06 DIAGNOSIS — R42 Dizziness and giddiness: Secondary | ICD-10-CM | POA: Insufficient documentation

## 2015-05-06 DIAGNOSIS — R112 Nausea with vomiting, unspecified: Secondary | ICD-10-CM | POA: Insufficient documentation

## 2015-05-06 DIAGNOSIS — F329 Major depressive disorder, single episode, unspecified: Secondary | ICD-10-CM | POA: Diagnosis not present

## 2015-05-06 DIAGNOSIS — Z3202 Encounter for pregnancy test, result negative: Secondary | ICD-10-CM | POA: Insufficient documentation

## 2015-05-06 DIAGNOSIS — Z79899 Other long term (current) drug therapy: Secondary | ICD-10-CM | POA: Diagnosis not present

## 2015-05-06 LAB — URINALYSIS COMPLETE WITH MICROSCOPIC (ARMC ONLY)
BILIRUBIN URINE: NEGATIVE
Bacteria, UA: NONE SEEN
Glucose, UA: NEGATIVE mg/dL
HGB URINE DIPSTICK: NEGATIVE
KETONES UR: NEGATIVE mg/dL
LEUKOCYTES UA: NEGATIVE
NITRITE: NEGATIVE
PH: 7 (ref 5.0–8.0)
Protein, ur: NEGATIVE mg/dL
Specific Gravity, Urine: 1.006 (ref 1.005–1.030)

## 2015-05-06 LAB — COMPREHENSIVE METABOLIC PANEL
ALT: 20 U/L (ref 14–54)
AST: 21 U/L (ref 15–41)
Albumin: 4.8 g/dL (ref 3.5–5.0)
Alkaline Phosphatase: 39 U/L (ref 38–126)
Anion gap: 5 (ref 5–15)
BILIRUBIN TOTAL: 0.4 mg/dL (ref 0.3–1.2)
BUN: 10 mg/dL (ref 6–20)
CO2: 27 mmol/L (ref 22–32)
CREATININE: 0.6 mg/dL (ref 0.44–1.00)
Calcium: 9.3 mg/dL (ref 8.9–10.3)
Chloride: 104 mmol/L (ref 101–111)
GFR calc Af Amer: >60 mL/min (ref >60)
Glucose, Bld: 137 mg/dL — ABNORMAL HIGH (ref 65–99)
Potassium: 3.7 mmol/L (ref 3.5–5.1)
Sodium: 136 mmol/L (ref 135–145)
TOTAL PROTEIN: 7.7 g/dL (ref 6.5–8.1)

## 2015-05-06 LAB — CBC
HCT: 40.1 % (ref 35.0–47.0)
Hemoglobin: 13.8 g/dL (ref 12.0–16.0)
MCH: 28.9 pg (ref 26.0–34.0)
MCHC: 34.4 g/dL (ref 32.0–36.0)
MCV: 84.1 fL (ref 80.0–100.0)
PLATELETS: 338 10*3/uL (ref 150–440)
RBC: 4.76 MIL/uL (ref 3.80–5.20)
RDW: 12.4 % (ref 11.5–14.5)
WBC: 10.8 10*3/uL (ref 3.6–11.0)

## 2015-05-06 LAB — LIPASE, BLOOD: Lipase: 24 U/L (ref 11–51)

## 2015-05-06 LAB — POCT PREGNANCY, URINE: Preg Test, Ur: NEGATIVE

## 2015-05-06 MED ORDER — ACETAMINOPHEN 500 MG PO TABS
1000.0000 mg | ORAL_TABLET | ORAL | Status: AC
  Administered 2015-05-06: 1000 mg via ORAL
  Filled 2015-05-06: qty 2

## 2015-05-06 MED ORDER — ONDANSETRON 4 MG PO TBDP
4.0000 mg | ORAL_TABLET | Freq: Once | ORAL | Status: AC
  Administered 2015-05-06: 4 mg via ORAL
  Filled 2015-05-06: qty 1

## 2015-05-06 MED ORDER — ONDANSETRON 4 MG PO TBDP: 4.0000 mg | ORAL_TABLET | Freq: Four times a day (QID) | ORAL | Status: DC | PRN

## 2015-05-06 NOTE — Discharge Instructions (Signed)

## 2015-05-06 NOTE — ED Provider Notes (Addendum)
[facility] Provider Note REMINDER - THIS NOTE IS NOT A FINAL MEDICAL RECORD UNTIL IT IS SIGNED. UNTIL THEN, THE CONTENT BELOW MAY REFLECT INFORMATION FROM A DOCUMENTATION TEMPLATE, NOT THE ACTUAL PATIENT VISIT. ____________________________________________  Time seen: Approximately 8:09 PM  I have reviewed the triage vital signs and the nursing notes.   HISTORY  Chief Complaint Dizziness and Nausea    HPI [facility] is a 24 y.o. female presents for evaluation of lightheadedness. She reports that she's felt slightly nauseated and occasionally lightheaded, "dizzy" which she describes as just a generally lightheaded feeling for about the last week. She reports that she started her first dose Abilify one week ago, and developed sudden nausea with vomiting about 2 hours after. She discontinue its use, and her nausea and vomiting have gone away however she continues just feel lightheadedness over the last week. Denies chest pain, palpitations, she is not pregnant, no further vomiting. She reports that she has been able to drink and eat at [employer] just prior to arrival to ER without problems holding it down.  She denies any thoughts of wanting to harm herself or others. No hallucinations. She reports that she has depression and loss of Zestril life in activities which her primary care doctor has been working with her on. She does continue take Effexor daily.  No pain or burning with urination. No vaginal symptoms or discharge.  No numbness weakness or trouble speaking. No Chest pain.   Past Medical History  Diagnosis Date  . Ovarian cyst     There are no active problems to display for this patient.   Past Surgical History  Procedure Laterality Date  . Tonsillectomy      Current Outpatient Rx  Name  Route  Sig  Dispense  Refill  . escitalopram (LEXAPRO) 20 MG tablet   Oral   Take 1 tablet (20 mg total) by mouth daily.   30  tablet   0   . traMADol (ULTRAM) 50 MG tablet   Oral   Take 1 tablet (50 mg total) by mouth every 6 (six) hours as needed.   20 tablet   0     Allergies Sulfa antibiotics  No family history on file.  Social History Social History  Substance Use Topics  . Smoking status: Never Smoker   . Smokeless tobacco: None  . Alcohol Use: No    Review of Systems Constitutional: No fever/chills Eyes: No visual changes. ENT: No sore throat. Cardiovascular: Denies chest pain. Respiratory: Denies shortness of breath. Gastrointestinal: No abdominal pain.  See history of present illness  No diarrhea.  No constipation. Genitourinary: Negative for dysuria. Musculoskeletal: Negative for back pain. Skin: Negative for rash. Neurological: Negative for headaches, focal weakness or numbness.  10-point ROS otherwise negative.  ____________________________________________   PHYSICAL EXAM:  VITAL SIGNS: ED Triage Vitals  Enc Vitals Group     BP 05/06/15 1828 119/78 mmHg     Pulse Rate 05/06/15 1828 85     Resp 05/06/15 1828 18     Temp 05/06/15 1828 98.4 F (36.9 C)     Temp Source 05/06/15 1828 Oral     SpO2 05/06/15 1828 100 %     Weight 05/06/15 1828 133 lb (60.328 kg)     Height 05/06/15 1828 5\' 1"  (1.549 m)     Head Cir --      Peak Flow --      Pain Score 05/06/15 1832 0  Pain Loc --      Pain Edu? --      Excl. in GC? --    Constitutional: Alert and oriented. Well appearing and in no acute distress. Currently drinking a  soda. Eyes: Conjunctivae are normal. PERRL. EOMI. Head: Atraumatic. Nose: No congestion/rhinnorhea. Mouth/Throat: Mucous membranes are moist.  Oropharynx non-erythematous. Neck: No stridor.   Cardiovascular: Normal rate, regular rhythm. Grossly normal heart sounds.  Good peripheral circulation. Respiratory: Normal respiratory effort.  No retractions. Lungs CTAB. Gastrointestinal: Soft and nontender. No distention. No abdominal bruits. No CVA  tenderness. Musculoskeletal: No lower extremity tenderness nor edema.  Neurologic:Extraocular movements normal. No ataxia. No pronator drift. 5 strength in all extremities. Normal sensation all extremities. Normal speech and language. No gross focal neurologic deficits are appreciated. No gait instability. Skin:  Skin is warm, dry and intact. No rash noted. Psychiatric: Mood and affect are normal. Speech and behavior are normal.  ____________________________________________   LABS (all labs ordered are listed, but only abnormal results are displayed)  Labs Reviewed  COMPREHENSIVE METABOLIC PANEL - Abnormal; Notable for the following:    Glucose, Bld 137 (*)    All other components within normal limits  URINALYSIS COMPLETEWITH MICROSCOPIC ([facility] ONLY) - Abnormal; Notable for the following:    Color, Urine STRAW (*)    APPearance CLEAR (*)    Squamous Epithelial / LPF 0-5 (*)    All other components within normal limits  LIPASE, BLOOD  CBC  POC URINE PREG, ED  POCT PREGNANCY, URINE   ____________________________________________  EKG  Was performed for evaluation of QT interval given report of being on Effexor and recently Abilify Ventricular rate 90 PR 140 QRS 92 QTc 450, within normal limits for female.  Nonspecific T-wave L [name] in inferior distribution, no evidence of ST elevation.  Reviewed and interpreted as nonspecific T-wave abnormality, no prolonged QT. Normal sinus rhythm. ____________________________________________  RADIOLOGY   ____________________________________________   PROCEDURES  Procedure(s) performed: None  Critical Care performed: No  ____________________________________________   INITIAL IMPRESSION / ASSESSMENT AND PLAN / ED COURSE  Pertinent labs & imaging results that were available during my care of the patient were reviewed by me and considered in my medical decision making (see chart for details).  Patient presents for vaginal  lightheadedness for about one week. She had nausea and vomiting briefly one week ago which has resolved except she just feels slightly nauseated times is not eating well. She is awake and alert and has been able to eat a sandwich and drink entire soda while here at the ER. Her exam is very reassuring. EKG was performed for QT evaluation, no cardiac symptoms. She does have some minimal nonspecific T-wave abnormality, but with no chest pain or trouble breathing or absolutely doubt acute ischemia.  No pleuritic chest pain, no shortness of breath. Not hypoxic. No signs or symptoms of pulmonary embolus and by clinical or physical exam.      Pulmonary Embolism Rule-out Criteria (PERC rule)                        If YES to ANY of the following, the PERC rule is not satisfied and cannot be used to rule out PE in this patient (consider d-dimer or imaging depending on pre-test probability).                      If NO to ALL of the following, AND the clinician's pre-test probability is <  15%, the PERC rule is satisfied and there is no need for further workup (including no need to obtain a d-dimer) as the post-test probability of pulmonary embolism is <2%.                      Mnemonic is HAD CLOTS   H - hormone use (exogenous estrogen)      No. A - age > 50                                                 No. D - DVT/PE history                                      No.   C - coughing blood (hemoptysis)                 No. L - leg swelling, unilateral                             No. O - O2 Sat on Room Air < 95%                  No. T - tachycardia (HR ? 100)                         No. S - surgery or trauma, recent                      No.   Based on my evaluation of the patient, including application of this decision instrument, further testing to evaluate for pulmonary embolism is not indicated at this time. I have discussed this recommendation with the patient who states understanding and agreement with  this plan.   Abdomen is soft nontender nondistended. Lab work, urinalysis all very reassuring. Discussed with the patient, and she will be taking anymore of Abilify which she stopped taking about one week ago. She will continue her Effexor and call her primary care doctor schedule close follow-up. Return precautions advised. ____________________________________________   FINAL CLINICAL IMPRESSION(S) / ED DIAGNOSES  Final diagnoses:  Lightheadedness      Sharyn Creamer, MD 05/06/15 2014  Sharyn Creamer, MD 05/06/15 2016  ----------------------------------------- 8:34 PM on 05/06/2015 -----------------------------------------  The time of discharge, the patient notified myself and the nurse that she wanted a CAT scan of her head. I discussed with her the risks and benefits, and based on my assessment her clinical symptoms, previous history, neurologic exam I would recommend against it at this time. I do not see a reason for emergent imaging, and I discussed her that I am concerned about the risks of radiation versus a true benefit of the study. She has no focal exam abnormalities. After further discussion, she will follow-up with her primary care doctor. If symptoms continue to persist, she will discuss her primary care. I did discuss return precautions with her in case she develops any severe headache, numbness, tingling, weakness trouble speaking or other new concerns arise she'll return to the emergency room right away.  Sharyn Creamer, MD 05/06/15 [phone]

## 2015-05-06 NOTE — ED Notes (Addendum)
C/o nausea and dizziness x 1 week.  Reports intermittent episode of vomiting and nausea since Sunday (12/25).  Patient in triage with food from [employer].

## 2015-05-08 HISTORY — PX: LEEP: SHX91

## 2015-05-08 NOTE — L&D Delivery Note (Signed)
Delivery Note Primary OB: [facility] Delivery Physician: Annamarie MajorPaul Hawthorne Day, MD Gestational Age: Full term Antepartum complications: Single Umbilical Artery Intrapartum complications: None  A viable Female was delivered via vertex perentation.  Apgars:8 ,9  Weight:  pending .   Placenta status: spontaneous and Intact.  Cord: 3+ vessels;  with the following complications: nuchal.  Anesthesia:  epidural Episiotomy:  none Lacerations:  none Suture Repair: none Est. Blood Loss (mL):  less than 100 mL  Mom to postpartum.  Baby to Couplet care / Skin to Skin.  Plans PP BTL.  Annamarie MajorPaul Jentry Warnell, MD Dept of OB/GYN [phone]

## 2015-05-12 MED ORDER — LIDOCAINE-EPINEPHRINE (PF) 1 %-1:200000 IJ SOLN
INTRAMUSCULAR | Status: AC
  Filled 2015-05-12: qty 30

## 2015-09-06 ENCOUNTER — Encounter: Payer: Self-pay | Admitting: Emergency Medicine

## 2015-09-06 ENCOUNTER — Emergency Department
Admission: EM | Admit: 2015-09-06 | Discharge: 2015-09-06 | Disposition: A | Discharge status: Home / Self Care | Payer: Managed Care, Other (non HMO) | Attending: Emergency Medicine | Admitting: Emergency Medicine

## 2015-09-06 DIAGNOSIS — M545 Low back pain, unspecified: Secondary | ICD-10-CM

## 2015-09-06 DIAGNOSIS — O26899 Other specified pregnancy related conditions, unspecified trimester: Secondary | ICD-10-CM | POA: Insufficient documentation

## 2015-09-06 DIAGNOSIS — Z3A00 Weeks of gestation of pregnancy not specified: Secondary | ICD-10-CM | POA: Insufficient documentation

## 2015-09-06 DIAGNOSIS — Z3490 Encounter for supervision of normal pregnancy, unspecified, unspecified trimester: Secondary | ICD-10-CM

## 2015-09-06 DIAGNOSIS — Z3201 Encounter for pregnancy test, result positive: Secondary | ICD-10-CM | POA: Insufficient documentation

## 2015-09-06 LAB — URINALYSIS COMPLETE WITH MICROSCOPIC (ARMC ONLY)
BACTERIA UA: NONE SEEN
Bilirubin Urine: NEGATIVE
Glucose, UA: NEGATIVE mg/dL
Ketones, ur: NEGATIVE mg/dL
LEUKOCYTES UA: NEGATIVE
NITRITE: NEGATIVE
PROTEIN: NEGATIVE mg/dL
SPECIFIC GRAVITY, URINE: 1.015 (ref 1.005–1.030)
pH: 7 (ref 5.0–8.0)

## 2015-09-06 LAB — POCT PREGNANCY, URINE: PREG TEST UR: POSITIVE — AB

## 2015-09-06 NOTE — ED Notes (Signed)
Pt presents to ED with left flank pain and lower back pain x 2 days.  Pt reports frequency with urination, denies any pain with urination.  Pt A/Ox4, vitals WDL, no immediate distress at this time.

## 2015-09-06 NOTE — ED Notes (Signed)
POCT urine preg positive

## 2015-09-06 NOTE — ED Notes (Signed)
Urine sent to lab earlier, called lab to see if they could run urine preg[facility] preg ordered for pt.

## 2015-09-06 NOTE — Discharge Instructions (Signed)
Call health Department and make an appointment for prenatal care. You may take sparingly amounts of Tylenol to help with your back pain. You may also use ice or heat to your back as needed for pain.

## 2015-09-06 NOTE — ED Provider Notes (Signed)
[facility] Emergency Department Provider Note   ____________________________________________  Time seen: Approximately 7:56 PM  I have reviewed the triage vital signs and the nursing notes.   HISTORY  Chief Complaint Flank Pain   HPI DAO MEARNS is a 25 y.o. female is here with complaint of left flank pain and low back pain for last 2 days. Patient reports frequency of urination without any other or burning. Patient has not taken any over-the-counter medication. She denies any fever, chills, nausea or vomiting. There is been no history of injury to her back. Patient states the area in question is mostly on the left but also she is experiencing some discomfort on the right. There is no radiation of the pain. There is no history of urinary tract infections or kidney stones.The patient rates her pain as an 8 out of 10.   Past Medical History  Diagnosis Date  . Ovarian cyst     There are no active problems to display for this patient.   Past Surgical History  Procedure Laterality Date  . Tonsillectomy    . Leep  Jan 2017    Current Outpatient Rx  Name  Route  Sig  Dispense  Refill  . escitalopram (LEXAPRO) 20 MG tablet   Oral   Take 1 tablet (20 mg total) by mouth daily.   30 tablet   0   . ondansetron (ZOFRAN ODT) 4 MG disintegrating tablet   Oral   Take 1 tablet (4 mg total) by mouth every 6 (six) hours as needed for nausea or vomiting.   20 tablet   0   . traMADol (ULTRAM) 50 MG tablet   Oral   Take 1 tablet (50 mg total) by mouth every 6 (six) hours as needed.   20 tablet   0     Allergies Sulfa antibiotics  No family history on file.  Social History Social History  Substance Use Topics  . Smoking status: Never Smoker   . Smokeless tobacco: None  . Alcohol Use: No    Review of Systems Constitutional: No fever/chills Cardiovascular: Denies chest pain. Respiratory: Denies shortness of breath. Gastrointestinal:  No abdominal pain.  No nausea, no vomiting.  No diarrhea.   Genitourinary: Positive for urinary frequency. Musculoskeletal: Positive for bilateral back pain. Skin: Negative for rash. Neurological: Negative for headaches, focal weakness or numbness.  10-point ROS otherwise negative.  ____________________________________________   PHYSICAL EXAM:  VITAL SIGNS: ED Triage Vitals  Enc Vitals Group     BP 09/06/15 1925 120/74 mmHg     Pulse Rate 09/06/15 1925 94     Resp 09/06/15 1925 16     Temp 09/06/15 1925 98.6 F (37 C)     Temp Source 09/06/15 1925 Oral     SpO2 09/06/15 1925 100 %     Weight 09/06/15 1925 128 lb (58.06 kg)     Height 09/06/15 1925  (1.575 m)     Head Cir --      Peak Flow --      Pain Score 09/06/15 1926 8     Pain Loc --      Pain Edu? --      Excl. in GC? --     Constitutional: Alert and oriented. Well appearing and in no acute distress. Eyes: Conjunctivae are normal. PERRL. EOMI. Head: Atraumatic. Nose: No congestion/rhinnorhea. Neck: No stridor.   Cardiovascular: Normal rate, regular rhythm. Grossly normal heart sounds.  Good peripheral circulation. Respiratory:  Normal respiratory effort.  No retractions. Lungs CTAB. Gastrointestinal: Soft and nontender. No distention.  No CVA tenderness. Musculoskeletal: On examination there is no CVA tenderness or flank pain. It is more muscle skeletal pain bilaterally. Range of motion is without restrictions and no active muscle spasms are seen. Straight leg raises were slightly positive on the left at 70. Right straight leg raises were negative. Neurologic:  Normal speech and language. No gross focal neurologic deficits are appreciated. No gait instability. Reflexes are 2+ bilaterally. Skin:  Skin is warm, dry and intact. No rash noted. Psychiatric: Mood and affect are normal. Speech and behavior are normal.  ____________________________________________   LABS (all labs ordered are listed, but only  abnormal results are displayed)  Labs Reviewed  URINALYSIS COMPLETEWITH MICROSCOPIC ([facility] ONLY) - Abnormal; Notable for the following:    Color, Urine YELLOW (*)    APPearance CLEAR (*)    Hgb urine dipstick 2+ (*)    Squamous Epithelial / LPF 0-5 (*)    All other components within normal limits  POCT PREGNANCY, URINE - Abnormal; Notable for the following:    Preg Test, Ur POSITIVE (*)    All other components within normal limits  POC URINE PREG, ED     PROCEDURES  Procedure(s) performed: None  Critical Care performed: No  ____________________________________________   INITIAL IMPRESSION / ASSESSMENT AND PLAN / ED COURSE  Pertinent labs & imaging results that were available during my care of the patient were reviewed by me and considered in my medical decision making (see chart for details).  Prior to give the patient medication for her back pain a pregnancy test was done and results were positive. Patient was made aware of these findings and she was given a printed copy of her positive results to take to the health department to establish prenatal care. Patient was told she could take sparingly amounts of Tylenol as needed for back pain. She is also encouraged to use moist heat or ice to her back as needed. ____________________________________________   FINAL CLINICAL IMPRESSION(S) / ED DIAGNOSES  Final diagnoses:  Bilateral low back pain without sciatica  Pregnancy      NEW MEDICATIONS STARTED DURING THIS VISIT:  Discharge Medication List as of 09/06/2015  8:47 PM       Note:  This document was prepared using Dragon voice recognition software and may include unintentional dictation errors.    Tommi RumpsRhonda L Tesla Bochicchio, PA-C 09/06/15 2352  Arnaldo NatalPaul F Malinda, MD 09/08/15 [phone]

## 2015-09-26 ENCOUNTER — Emergency Department
Admission: EM | Admit: 2015-09-26 | Discharge: 2015-09-26 | Disposition: A | Discharge status: Home / Self Care | Payer: Managed Care, Other (non HMO) | Attending: Emergency Medicine | Admitting: Emergency Medicine

## 2015-09-26 ENCOUNTER — Encounter: Payer: Self-pay | Admitting: Emergency Medicine

## 2015-09-26 DIAGNOSIS — Z3A01 Less than 8 weeks gestation of pregnancy: Secondary | ICD-10-CM | POA: Insufficient documentation

## 2015-09-26 DIAGNOSIS — O210 Mild hyperemesis gravidarum: Secondary | ICD-10-CM | POA: Diagnosis not present

## 2015-09-26 DIAGNOSIS — O219 Vomiting of pregnancy, unspecified: Secondary | ICD-10-CM

## 2015-09-26 LAB — COMPREHENSIVE METABOLIC PANEL
ALT: 19 U/L (ref 14–54)
ANION GAP: 5 (ref 5–15)
AST: 17 U/L (ref 15–41)
Albumin: 4.4 g/dL (ref 3.5–5.0)
Alkaline Phosphatase: 29 U/L — ABNORMAL LOW (ref 38–126)
BILIRUBIN TOTAL: 0.5 mg/dL (ref 0.3–1.2)
BUN: 7 mg/dL (ref 6–20)
CALCIUM: 8.9 mg/dL (ref 8.9–10.3)
CHLORIDE: 105 mmol/L (ref 101–111)
CO2: 23 mmol/L (ref 22–32)
Creatinine, Ser: 0.48 mg/dL (ref 0.44–1.00)
GFR calc Af Amer: >60 mL/min (ref >60)
GFR calc non Af Amer: >60 mL/min (ref >60)
GLUCOSE: 83 mg/dL (ref 65–99)
Potassium: 3.8 mmol/L (ref 3.5–5.1)
Sodium: 133 mmol/L — ABNORMAL LOW (ref 135–145)
Total Protein: 7 g/dL (ref 6.5–8.1)

## 2015-09-26 LAB — LIPASE, BLOOD: Lipase: 25 U/L (ref 11–51)

## 2015-09-26 LAB — URINALYSIS COMPLETE WITH MICROSCOPIC (ARMC ONLY)
BACTERIA UA: NONE SEEN
BILIRUBIN URINE: NEGATIVE
Glucose, UA: NEGATIVE mg/dL
HGB URINE DIPSTICK: NEGATIVE
Ketones, ur: NEGATIVE mg/dL
Nitrite: NEGATIVE
PH: 6 (ref 5.0–8.0)
PROTEIN: NEGATIVE mg/dL
Specific Gravity, Urine: 1.025 (ref 1.005–1.030)

## 2015-09-26 LAB — CBC
HCT: 36.6 % (ref 35.0–47.0)
HEMOGLOBIN: 12.9 g/dL (ref 12.0–16.0)
MCH: 29.1 pg (ref 26.0–34.0)
MCHC: 35.3 g/dL (ref 32.0–36.0)
MCV: 82.4 fL (ref 80.0–100.0)
Platelets: 342 10*3/uL (ref 150–440)
RBC: 4.44 MIL/uL (ref 3.80–5.20)
RDW: 12.6 % (ref 11.5–14.5)
WBC: 9.6 10*3/uL (ref 3.6–11.0)

## 2015-09-26 MED ORDER — SODIUM CHLORIDE 0.9 % IV BOLUS (SEPSIS)
1000.0000 mL | Freq: Once | INTRAVENOUS | Status: AC
  Administered 2015-09-26: 1000 mL via INTRAVENOUS

## 2015-09-26 MED ORDER — ONDANSETRON 4 MG PO TBDP: 4.0000 mg | ORAL_TABLET | Freq: Three times a day (TID) | ORAL | Status: DC | PRN

## 2015-09-26 MED ORDER — ONDANSETRON HCL 4 MG/2ML IJ SOLN
INTRAMUSCULAR | Status: AC
  Administered 2015-09-26: 4 mg via INTRAVENOUS
  Filled 2015-09-26: qty 2

## 2015-09-26 MED ORDER — ONDANSETRON HCL 4 MG/2ML IJ SOLN
4.0000 mg | Freq: Once | INTRAMUSCULAR | Status: AC
  Administered 2015-09-26: 4 mg via INTRAVENOUS

## 2015-09-26 NOTE — Discharge Instructions (Signed)

## 2015-09-26 NOTE — ED Notes (Signed)
States was seen here previously and told she was pregnant. Has been having vomiting since. Denies vag bleed, fluid or abd pain. Denies fevers. States has not tried anything for nausea yet. Will see MD for pregnancy as first visit. Has vomited 4 times today.

## 2015-09-26 NOTE — ED Provider Notes (Signed)
[facility] Emergency Department Provider Note  Time seen: 6:01 PM  I have reviewed the triage vital signs and the nursing notes.   HISTORY  Chief Complaint Morning Sickness    HPI Julie Moss is a 25 y.o. female with a past medical history of ovarian cysts, G4, P2 A1 who presents to the emergency department with nausea and vomiting. According to the patient she is approximately [redacted] weeks pregnant, since she cannot not she is pregnant [redacted] weeks ago she has had persistent nausea and vomiting. Denies any abdominal pain. States a history of morning sickness with prior pregnancies but states that it never been this bad. Patient has a OB appointment but not until next week, and they cannot see her sooner so she came to the emergency department. Denies fever, diarrhea, or abdominal pain. Denies vaginal symptoms.     Past Medical History  Diagnosis Date  . Ovarian cyst     There are no active problems to display for this patient.   Past Surgical History  Procedure Laterality Date  . Tonsillectomy    . Leep  Jan 2017    Current Outpatient Rx  Name  Route  Sig  Dispense  Refill  . escitalopram (LEXAPRO) 20 MG tablet   Oral   Take 1 tablet (20 mg total) by mouth daily.   30 tablet   0   . ondansetron (ZOFRAN ODT) 4 MG disintegrating tablet   Oral   Take 1 tablet (4 mg total) by mouth every 6 (six) hours as needed for nausea or vomiting.   20 tablet   0   . traMADol (ULTRAM) 50 MG tablet   Oral   Take 1 tablet (50 mg total) by mouth every 6 (six) hours as needed.   20 tablet   0     Allergies Sulfa antibiotics  No family history on file.  Social History Social History  Substance Use Topics  . Smoking status: Never Smoker   . Smokeless tobacco: None  . Alcohol Use: No    Review of Systems Constitutional: Negative for fever. Cardiovascular: Negative for chest pain. Respiratory: Negative for shortness of breath. Gastrointestinal:  Negative for abdominal pain. Positive for nausea and vomiting. Negative diarrhea. Genitourinary: Negative for dysuria. Neurological: Negative for headache 10-point ROS otherwise negative.  ____________________________________________   PHYSICAL EXAM:  VITAL SIGNS: ED Triage Vitals  Enc Vitals Group     BP 09/26/15 1608 115/71 mmHg     Pulse Rate 09/26/15 1608 68     Resp 09/26/15 1608 18     Temp 09/26/15 1608 98.5 F (36.9 C)     Temp Source 09/26/15 1608 Oral     SpO2 09/26/15 1608 100 %     Weight 09/26/15 1608 135 lb (61.236 kg)     Height 09/26/15 1608  (1.549 m)     Head Cir --      Peak Flow --      Pain Score 09/26/15 1609 0     Pain Loc --      Pain Edu? --      Excl. in GC? --     Constitutional: Alert and oriented. Well appearing and in no distress. Eyes: Normal exam ENT   Head: Normocephalic and atraumatic.   Mouth/Throat: Mucous membranes are moist. Cardiovascular: Normal rate, regular rhythm. No murmur Respiratory: Normal respiratory effort without tachypnea nor retractions. Breath sounds are clear  Gastrointestinal: Soft and nontender. No distention.   Musculoskeletal: Nontender  with normal range of motion in all extremities Neurologic:  Normal speech and language. No gross focal neurologic deficits  Skin:  Skin is warm, dry and intact.  Psychiatric: Mood and affect are normal.  ____________________________________________    INITIAL IMPRESSION / ASSESSMENT AND PLAN / ED COURSE  Pertinent labs & imaging results that were available during my care of the patient were reviewed by me and considered in my medical decision making (see chart for details).  The patient presents the emergency department approximately [redacted] weeks pregnant with nausea and vomiting. Patient's lab workup is overall within normal limits, slightly low sodium, negative for ketones in her urine. We will dose of Zofran, and IV fluids. We will closely monitor the patient in the  emergency department suspect likely privacy related nausea and vomiting.  Patient's labs are largely within normal limits. Patient states she is feeling better after IV fluids and Zofran. We'll discharge the patient home with GYN follow-up.  ____________________________________________   FINAL CLINICAL IMPRESSION(S) / ED DIAGNOSES  Nausea and vomiting during pregnancy   Minna AntisKevin Erskin Zinda, MD 09/26/15 [phone]

## 2015-12-10 ENCOUNTER — Encounter: Payer: Self-pay | Admitting: Emergency Medicine

## 2015-12-10 ENCOUNTER — Emergency Department
Admission: EM | Admit: 2015-12-10 | Discharge: 2015-12-10 | Disposition: A | Discharge status: Home / Self Care | Payer: Managed Care, Other (non HMO) | Attending: Emergency Medicine | Admitting: Emergency Medicine

## 2015-12-10 DIAGNOSIS — Z3A19 19 weeks gestation of pregnancy: Secondary | ICD-10-CM | POA: Insufficient documentation

## 2015-12-10 DIAGNOSIS — R519 Headache, unspecified: Secondary | ICD-10-CM

## 2015-12-10 DIAGNOSIS — Z79899 Other long term (current) drug therapy: Secondary | ICD-10-CM | POA: Diagnosis not present

## 2015-12-10 DIAGNOSIS — R51 Headache: Secondary | ICD-10-CM | POA: Insufficient documentation

## 2015-12-10 DIAGNOSIS — O99352 Diseases of the nervous system complicating pregnancy, second trimester: Secondary | ICD-10-CM | POA: Insufficient documentation

## 2015-12-10 DIAGNOSIS — O26892 Other specified pregnancy related conditions, second trimester: Secondary | ICD-10-CM | POA: Diagnosis present

## 2015-12-10 HISTORY — DX: Unspecified mood (affective) disorder: F39

## 2015-12-10 MED ORDER — DIPHENHYDRAMINE HCL 25 MG PO CAPS
25.0000 mg | ORAL_CAPSULE | Freq: Once | ORAL | Status: AC
  Administered 2015-12-10: 25 mg via ORAL
  Filled 2015-12-10: qty 1

## 2015-12-10 MED ORDER — METOCLOPRAMIDE HCL 10 MG PO TABS
10.0000 mg | ORAL_TABLET | Freq: Once | ORAL | Status: AC
  Administered 2015-12-10: 10 mg via ORAL
  Filled 2015-12-10: qty 1

## 2015-12-10 MED ORDER — METOCLOPRAMIDE HCL 5 MG PO TABS: 5.0000 mg | ORAL_TABLET | Freq: Three times a day (TID) | ORAL | 0 refills | Status: DC | PRN

## 2015-12-10 MED ORDER — ACETAMINOPHEN 500 MG PO TABS
1000.0000 mg | ORAL_TABLET | Freq: Once | ORAL | Status: AC
  Administered 2015-12-10: 1000 mg via ORAL
  Filled 2015-12-10: qty 2

## 2015-12-10 NOTE — ED Notes (Signed)
Headache since noon. Took tylenol 630 pm. Hx of migraines.

## 2015-12-10 NOTE — ED Triage Notes (Signed)
Pt says she woke today around 10am with headache between her eyes; was able to take a nap a few hours later but headache still present when she woke; took 2 tylenol about 2 hours ago with no relief; reports nausea; pt is [redacted] weeks pregnant, due 05/08/2016

## 2015-12-10 NOTE — Discharge Instructions (Signed)
Take OTC Benadryl and Tylenol, safely in pregnancy to stop headache pain. Dose the prescription anti-nausea medicine as needed. Increase fluid intake and follow-up with your provider as needed.

## 2015-12-11 NOTE — ED Provider Notes (Signed)
[facility] Emergency Department Provider Note ____________________________________________  Time seen: 10:35 pm  I have reviewed the triage vital signs and the nursing notes.  HISTORY  Chief Complaint  Migraine  HPI Julie Moss is a 25 y.o. female presents to the ED for evaluation of a headache with onset today. Patient is [redacted] weeks pregnant with a singleton pregnancy, with complaint of a headache primarily behind her left eye. She gives a remote history of migraine history, but denies any current preventative or abortive medications. She describes taking a single Tylenol tablet, 325 mg, at about 6 PM. She reports that that did slightly improve her headache pain. She also reports nausea, but notes that she has been nauseated since the onset of her pregnancy. She denies any distal paresthesias, vision changes, dizziness, or vertigo.  Past Medical History:  Diagnosis Date  . Mood disorder (HCC)   . Ovarian cyst     There are no active problems to display for this patient.   Past Surgical History:  Procedure Laterality Date  . LEEP  Jan 2017  . TONSILLECTOMY      Prior to Admission medications   Medication Sig Start Date End Date Taking? Authorizing Provider  lamoTRIgine (LAMICTAL) 100 MG tablet Take 100 mg by mouth daily.   Yes Historical Provider, MD  venlafaxine (EFFEXOR) 75 MG tablet Take 75 mg by mouth 1 day or 1 dose.   Yes Historical Provider, MD  escitalopram (LEXAPRO) 20 MG tablet Take 1 tablet (20 mg total) by mouth daily. 03/03/15 03/02/16  Christiane HaJonathan D Cuthriell, PA-C  metoCLOPramide (REGLAN) 5 MG tablet Take 1 tablet (5 mg total) by mouth every 8 (eight) hours as needed for nausea or vomiting. 12/10/15   Charlesetta IvoryJenise V Bacon Daman Steffenhagen, PA-C  ondansetron (ZOFRAN ODT) 4 MG disintegrating tablet Take 1 tablet (4 mg total) by mouth every 8 (eight) hours as needed for nausea or vomiting. 09/26/15   Minna AntisKevin Paduchowski, MD    Allergies Sulfa  antibiotics  History reviewed. No pertinent family history.  Social History Social History  Substance Use Topics  . Smoking status: Never Smoker  . Smokeless tobacco: Never Used  . Alcohol use No   Review of Systems  Constitutional: Negative for fever. Eyes: Negative for visual changes. Respiratory: Negative for shortness of breath. Gastrointestinal: Negative for abdominal pain, vomiting and diarrhea. Musculoskeletal: Negative for back pain. Neurological: Negative for focal weakness or numbness. Reports headache as above. ____________________________________________  PHYSICAL EXAM:  VITAL SIGNS: ED Triage Vitals  Enc Vitals Group     BP 12/10/15 2017 113/72     Pulse Rate 12/10/15 2017 79     Resp 12/10/15 2017 18     Temp 12/10/15 2017 98.4 F (36.9 C)     Temp Source 12/10/15 2017 Oral     SpO2 12/10/15 2017 100 %     Weight 12/10/15 2017 130 lb (59 kg)     Height 12/10/15 2017 5\' 1"  (1.549 m)     Head Circumference --      Peak Flow --      Pain Score 12/10/15 2019 8     Pain Loc --      Pain Edu? --      Excl. in GC? --     Constitutional: Alert and oriented. Well appearing and in no distress. Head: Normocephalic and atraumatic.      Eyes: Conjunctivae are normal. PERRL. Normal extraocular movements      Ears: Canals clear. TMs intact  bilaterally.   Nose: No congestion/rhinorrhea.   Mouth/Throat: Mucous membranes are moist.   Neck: Supple. No thyromegaly. Cardiovascular: Normal rate, regular rhythm.  Respiratory: Normal respiratory effort. No wheezes/rales/rhonchi. Gastrointestinal: Soft and nontender. No distention. Musculoskeletal: Nontender with normal range of motion in all extremities.  Neurologic:  Normal gait without ataxia. Normal speech and language. No gross focal neurologic deficits are appreciated. Psychiatric: Mood and affect are normal. Patient exhibits appropriate insight and  judgment. ____________________________________________  PROCEDURES  Benadryl 25 mg PO Reglan 10 mg PO Tylenol 1000 mg PO ____________________________________________  INITIAL IMPRESSION / ASSESSMENT AND PLAN / ED COURSE  Patient with near resolution of her headache pain at discharge. She reports her pain at a 4/10 in triage. Patient is seen ambulating to the bathroom without difficulty. She is requesting discharge prior to her clinical recheck. Patient is discharged with instructions on the use of Tylenol, Benadryl, and a prescription for Reglan to dose. She will follow-up with the primary care provider or her OB provider as needed.  Clinical Course   ____________________________________________  FINAL CLINICAL IMPRESSION(S) / ED DIAGNOSES  Final diagnoses:  Acute nonintractable headache, unspecified headache type      Lissa Hoard, PA-C 12/11/15 0014    Emily Filbert, MD 12/14/15 [phone]

## 2016-01-20 ENCOUNTER — Encounter: Payer: Self-pay | Admitting: Certified Nurse Midwife

## 2016-01-20 ENCOUNTER — Observation Stay
Admission: EM | Admit: 2016-01-20 | Discharge: 2016-01-20 | Disposition: A | Discharge status: Home / Self Care | Payer: Managed Care, Other (non HMO) | Attending: Obstetrics and Gynecology | Admitting: Obstetrics and Gynecology

## 2016-01-20 DIAGNOSIS — O26892 Other specified pregnancy related conditions, second trimester: Secondary | ICD-10-CM | POA: Diagnosis not present

## 2016-01-20 DIAGNOSIS — M545 Low back pain: Secondary | ICD-10-CM | POA: Diagnosis not present

## 2016-01-20 DIAGNOSIS — Z3A24 24 weeks gestation of pregnancy: Secondary | ICD-10-CM | POA: Diagnosis not present

## 2016-01-20 DIAGNOSIS — R109 Unspecified abdominal pain: Secondary | ICD-10-CM | POA: Diagnosis not present

## 2016-01-20 DIAGNOSIS — O26899 Other specified pregnancy related conditions, unspecified trimester: Secondary | ICD-10-CM

## 2016-01-20 LAB — URINALYSIS COMPLETE WITH MICROSCOPIC (ARMC ONLY)
BACTERIA UA: NONE SEEN
BILIRUBIN URINE: NEGATIVE
GLUCOSE, UA: NEGATIVE mg/dL
HGB URINE DIPSTICK: NEGATIVE
Ketones, ur: NEGATIVE mg/dL
LEUKOCYTES UA: NEGATIVE
NITRITE: NEGATIVE
Protein, ur: NEGATIVE mg/dL
SPECIFIC GRAVITY, URINE: 1.013 (ref 1.005–1.030)
Squamous Epithelial / LPF: NONE SEEN
pH: 7 (ref 5.0–8.0)

## 2016-01-20 MED ORDER — TERCONAZOLE 0.8 % VA CREA: 1.0000 | TOPICAL_CREAM | Freq: Every day | VAGINAL | 0 refills | Status: DC

## 2016-01-20 NOTE — Plan of Care (Signed)
c gutierrez,cnm in to see pt. Sterile speculum exam done. Cervix long, thick and closed. Clean catch urine collected and sent to lab. Will await result before discharge. Fetal monitor removed per c gutierrez,cnm.

## 2016-01-20 NOTE — Plan of Care (Signed)
Pt d/c home with d/c instructions and prescription for terazol

## 2016-01-20 NOTE — Plan of Care (Signed)
Pt arrived in L/D with c/o lower abdominal pains that started approximately 1 hour before arrival. Pt states she was at work when the pains started. c gutierrez,cnm notified

## 2016-01-20 NOTE — Final Progress Note (Signed)
Physician Final Progress Note  Patient ID: Julie ShieldsBrittany S Mountjoy MRN: [id number] DOB/AGE: 1991-02-24 25 y.o.  Admit date: 01/20/2016 Admitting provider: Vena AustriaAndreas Staebler, MD Discharge date: 01/20/2016   Admission Diagnoses: IUP at 24wk3d with lower abdominal and lower back pain Vulvar itching  Discharge Diagnoses:  IUP at 24wk3d Monilial vulvovaginitis  Consults: none  Significant Findings/ Diagnostic Studies: 25 year old G4 P2012 with EDC=05/08/2016 by LMP=08/02/2015 presented at 24.3 weeks with complaints of lower abdominal pain and lower back pain that began at 1430 today while she was at work. The pain is intermittent (every 3-4 minutes on arrival) and is in the sacral area and the mid lower abdomen.  No precipitating event. Denies dysuria or bad odor to urine. No fever, chills, diarrhea, constipation, nausea/vomiting. Having a little vulvar itching. Good FM.  [facility]  A history of a LEEP in Jan 2017, a 2 vessel cord, a mood disorder (sees [facility]-on Prozac, Effexor, Lamictal), and HSV II.  Exam: Temp 98.3 F (36.8 C)   LMP 08/02/2015 (LMP Unknown) 105/66 General: somewhat flat affect, in NAD, texting during interview Abdomen: soft, NT, gravid FHR 150 with accelerations to 160s-170s,  Toco: one contraction in 1.5 hours Vulva: inflamed vestibule Vagina: white cremey discharge Cervix: L/TH/closed/OOP  Wet prep: positive hyphae, negative for Trich/clue cells Results for orders placed or performed during the hospital encounter of 01/20/16 (from the past 24 hour(s))  Urinalysis complete, with microscopic ([facility] only)     Status: Abnormal   Collection Time: 01/20/16  5:21 PM  Result Value Ref Range   Color, Urine YELLOW (A) YELLOW   APPearance CLEAR (A) CLEAR   Glucose, UA NEGATIVE NEGATIVE mg/dL   Bilirubin Urine NEGATIVE NEGATIVE   Ketones, ur NEGATIVE NEGATIVE mg/dL   Specific Gravity, Urine 1.013 1.005 - 1.030   Hgb urine dipstick NEGATIVE  NEGATIVE   pH 7.0 5.0 - 8.0   Protein, ur NEGATIVE NEGATIVE mg/dL   Nitrite NEGATIVE NEGATIVE   Leukocytes, UA NEGATIVE NEGATIVE   RBC / HPF 0-5 0 - 5 RBC/hpf   WBC, UA 0-5 0 - 5 WBC/hpf   Bacteria, UA NONE SEEN NONE SEEN   Squamous Epithelial / LPF NONE SEEN NONE SEEN   Mucous PRESENT   A: IUP at 24 wks3 days with lower back and abdominal pain-possibly due to fetal position or FM No evidence of labor or UTI Monilial vulvovaginitis P: Discharge home RX: Terazol 3 cream  Procedures: none  Discharge Condition: stable  Disposition: 01-Home or Self Care  Diet: Regular diet  Discharge Activity: Activity as tolerated     Medication List    TAKE these medications   FLUoxetine 10 MG tablet Commonly known as:  PROZAC Take 10 mg by mouth daily.   lamoTRIgine 100 MG tablet Commonly known as:  LAMICTAL Take 100 mg by mouth daily.   terconazole 0.8 % vaginal cream Commonly known as:  TERAZOL 3 Place 1 applicator vaginally at bedtime.   venlafaxine 37.5 MG tablet Commonly known as:  EFFEXOR Take 37.5 mg by mouth daily.      Follow up at next appt as scheduled  Total time spent taking care of this patient: 20 minutes  Signed: Farrel ConnersGUTIERREZ, Hogan Hoobler 01/20/2016, 6:08 PM

## 2016-03-01 ENCOUNTER — Encounter: Payer: Self-pay | Admitting: Emergency Medicine

## 2016-03-01 ENCOUNTER — Emergency Department
Admission: EM | Admit: 2016-03-01 | Discharge: 2016-03-01 | Disposition: A | Discharge status: Home / Self Care | Payer: Managed Care, Other (non HMO) | Attending: Emergency Medicine | Admitting: Emergency Medicine

## 2016-03-01 DIAGNOSIS — Y92009 Unspecified place in unspecified non-institutional (private) residence as the place of occurrence of the external cause: Secondary | ICD-10-CM | POA: Insufficient documentation

## 2016-03-01 DIAGNOSIS — O9A213 Injury, poisoning and certain other consequences of external causes complicating pregnancy, third trimester: Secondary | ICD-10-CM | POA: Diagnosis not present

## 2016-03-01 DIAGNOSIS — Y999 Unspecified external cause status: Secondary | ICD-10-CM | POA: Insufficient documentation

## 2016-03-01 DIAGNOSIS — Y9389 Activity, other specified: Secondary | ICD-10-CM | POA: Insufficient documentation

## 2016-03-01 DIAGNOSIS — Z3A30 30 weeks gestation of pregnancy: Secondary | ICD-10-CM | POA: Diagnosis not present

## 2016-03-01 DIAGNOSIS — S0990XA Unspecified injury of head, initial encounter: Secondary | ICD-10-CM

## 2016-03-01 MED ORDER — ONDANSETRON 4 MG PO TBDP: 4.0000 mg | ORAL_TABLET | Freq: Three times a day (TID) | ORAL | 0 refills | Status: DC | PRN

## 2016-03-01 MED ORDER — ONDANSETRON 4 MG PO TBDP
4.0000 mg | ORAL_TABLET | Freq: Once | ORAL | Status: AC
  Administered 2016-03-01: 4 mg via ORAL
  Filled 2016-03-01: qty 1

## 2016-03-01 MED ORDER — HYDROCODONE-ACETAMINOPHEN 5-325 MG PO TABS
1.0000 | ORAL_TABLET | Freq: Once | ORAL | Status: AC
Start: 2016-03-01 — End: 2016-03-01
  Administered 2016-03-01: 1 via ORAL
  Filled 2016-03-01: qty 1
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
## 2016-03-01 NOTE — ED Notes (Signed)
Pt discharged to home.  Friend driving.  Discharge instructions reviewed.  Verbalized understanding.  No questions or concerns at this time.  Teach back verified.  Pt in NAD.  No items left in ED.   
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
## 2016-03-01 NOTE — Discharge Instructions (Signed)
1. You may take Tylenol as needed for headache. 2. You may take Zofran as needed for nausea. 3. Apply ice to affected area several times daily. 4. Return to the ER for worsening symptoms, persistent vomiting, lethargy or other concerns.

## 2016-03-20 ENCOUNTER — Encounter: Payer: Self-pay | Admitting: *Deleted

## 2016-03-20 ENCOUNTER — Observation Stay
Admission: EM | Admit: 2016-03-20 | Discharge: 2016-03-20 | Disposition: A | Discharge status: Home / Self Care | Payer: Managed Care, Other (non HMO) | Attending: Obstetrics and Gynecology | Admitting: Obstetrics and Gynecology

## 2016-03-20 DIAGNOSIS — M545 Low back pain: Secondary | ICD-10-CM | POA: Diagnosis not present

## 2016-03-20 DIAGNOSIS — O26893 Other specified pregnancy related conditions, third trimester: Principal | ICD-10-CM | POA: Insufficient documentation

## 2016-03-20 DIAGNOSIS — Z3A33 33 weeks gestation of pregnancy: Secondary | ICD-10-CM | POA: Diagnosis not present

## 2016-03-20 DIAGNOSIS — O9989 Other specified diseases and conditions complicating pregnancy, childbirth and the puerperium: Secondary | ICD-10-CM

## 2016-03-20 DIAGNOSIS — O99891 Other specified diseases and conditions complicating pregnancy: Secondary | ICD-10-CM | POA: Diagnosis present

## 2016-03-20 DIAGNOSIS — M549 Dorsalgia, unspecified: Secondary | ICD-10-CM | POA: Diagnosis present

## 2016-03-20 MED ORDER — PRENATAL MULTIVITAMIN CH: 1.0000 | ORAL_TABLET | Freq: Every day | ORAL | Status: DC

## 2016-03-20 MED ORDER — ACETAMINOPHEN 325 MG PO TABS: 650.0000 mg | ORAL_TABLET | ORAL | Status: DC | PRN

## 2016-03-20 NOTE — Discharge Summary (Signed)
Pt d/c'd to home in stable condition. Given labor precautions, fetal kick count instructions. Verbalized understanding. Follow up appointment made. No other concerns.

## 2016-03-20 NOTE — Discharge Instructions (Signed)
Use heat and ice alternately for your back pain. Massage can be helpful for muscular pain. You can use a heating pad--be sure that it will turn off on its own, and do not leave it on for more than 30 minutes at a time.

## 2016-03-20 NOTE — OB Triage Note (Signed)
G5P2 presents at 33w  C/o constant back pain, 6/10 on the pain scale.

## 2016-03-20 NOTE — Discharge Summary (Signed)
Obstetric History and Physical  Julie ShieldsBrittany S Moss is a 25 y.o. [phone] with Estimated Date of Delivery: 05/08/16  who presents at 968w0d  presenting for low back pain x 2 days. Pain was worse today so she came in for evaluation. Patient states she has been having no contractions, no vaginal bleeding, intact membranes, with active fetal movement.    Prenatal Course Source of Care: [facility] Pregnancy complications or risks: (no records available at this time)  Patient Active Problem List   Diagnosis Date Noted  . Back pain affecting pregnancy 03/20/2016  . Abdominal pain affecting pregnancy 01/20/2016      Prenatal Transfer Tool   Past Medical History:  Diagnosis Date  . Mood disorder (HCC)   . Ovarian cyst     Past Surgical History:  Procedure Laterality Date  . LEEP  Jan 2017  . TONSILLECTOMY      OB History  Gravida Para Term Preterm AB Living  4 2 2   1 2   SAB TAB Ectopic Multiple Live Births  1            # Outcome Date GA Lbr Len/2nd Weight Sex Delivery Anes PTL Lv  4 Current           3 Term 12/28/10   6 lb 14 oz (3.118 kg) M Vag-Spont     2 Term 01/15/05   6 lb 11 oz (3.033 kg) F Vag-Spont     1 SAB               Social History   Social History  . Marital status: Single    Spouse name: N/A  . Number of children: N/A  . Years of education: N/A   Social History Main Topics  . Smoking status: Never Smoker  . Smokeless tobacco: Never Used  . Alcohol use No  . Drug use: No  . Sexual activity: Yes   Other Topics Concern  . None   Social History Narrative  . None    History reviewed. No pertinent family history.  Prescriptions Prior to Admission  Medication Sig Dispense Refill Last Dose  . Prenatal Vit-Fe Fumarate-FA (MULTIVITAMIN-PRENATAL) 27-0.8 MG TABS tablet Take 1 tablet by mouth daily at 12 noon.     Marland Kitchen. FLUoxetine (PROZAC) 10 MG tablet Take 10 mg by mouth daily.   Not Taking at Unknown time  . lamoTRIgine (LAMICTAL) 100 MG tablet Take 100 mg by  mouth daily.   Not Taking at Unknown time  . ondansetron (ZOFRAN ODT) 4 MG disintegrating tablet Take 1 tablet (4 mg total) by mouth every 8 (eight) hours as needed for nausea or vomiting. (Patient not taking: Reported on 03/20/2016) 20 tablet 0 Not Taking at Unknown time  . terconazole (TERAZOL 3) 0.8 % vaginal cream Place 1 applicator vaginally at bedtime. (Patient not taking: Reported on 03/20/2016) 20 g 0 Not Taking at Unknown time  . venlafaxine (EFFEXOR) 37.5 MG tablet Take 37.5 mg by mouth daily.   Not Taking at Unknown time    Allergies  Allergen Reactions  . Sulfa Antibiotics Rash    Review of Systems: Negative except for what is mentioned in HPI.  Physical Exam: LMP 08/02/2015 (LMP Unknown)  GENERAL: Well-developed, well-nourished female in no acute distress.  LUNGS: unlabored breathing ABDOMEN: Soft, nontender, nondistended, gravid. CVA: no CVAT bilateral Cervical Exam: Dilatation 0cm   Effacement 50%   Station -1   Presentation: cephalic FHT: Category: 1 Baseline rate 130 bpm   Variability moderate  Accelerations present   Decelerations none Contractions: none   Pertinent Labs/Studies:   No results found for this or any previous visit (from the past 24 hour(s)).  Assessment : IUP at 6662w0d, musculoskeletal pain of pregnancy, no evidence of preterm labor  Plan: Discharge Home  Comfort measures discussed including heat/ice, yoga stretches F/u as scheduled at office on 11/20

## 2016-04-25 ENCOUNTER — Observation Stay
Admission: EM | Admit: 2016-04-25 | Discharge: 2016-04-25 | Disposition: A | Discharge status: Home / Self Care | Payer: Managed Care, Other (non HMO) | Attending: Obstetrics & Gynecology | Admitting: Obstetrics & Gynecology

## 2016-04-25 DIAGNOSIS — Z3490 Encounter for supervision of normal pregnancy, unspecified, unspecified trimester: Secondary | ICD-10-CM | POA: Diagnosis present

## 2016-04-25 DIAGNOSIS — O288 Other abnormal findings on antenatal screening of mother: Secondary | ICD-10-CM | POA: Diagnosis present

## 2016-04-25 DIAGNOSIS — Z3493 Encounter for supervision of normal pregnancy, unspecified, third trimester: Secondary | ICD-10-CM | POA: Diagnosis not present

## 2016-04-25 MED ORDER — ONDANSETRON HCL 4 MG/2ML IJ SOLN: 4.0000 mg | Freq: Four times a day (QID) | INTRAMUSCULAR | Status: DC | PRN

## 2016-04-25 MED ORDER — ACETAMINOPHEN 325 MG PO TABS: 650.0000 mg | ORAL_TABLET | ORAL | Status: DC | PRN

## 2016-04-25 NOTE — Discharge Instructions (Signed)
Drink plenty of fluids, rest frequently.  °

## 2016-04-25 NOTE — OB Triage Note (Signed)
Patient presents to unit after equivocal NST in office today.

## 2016-04-25 NOTE — Final Progress Note (Signed)
Physician Final Progress Note  Patient ID: Julie Moss MRN: [id number] DOB/AGE: [birth year] 25 y.o.  Admit date: 04/25/2016 Admitting provider: Nadara Mustardobert P Felita Bump, MD Discharge date: 04/25/2016   Admission Diagnoses: Nonreactive NST in office; pt has risk factor of single umbilical artery  Discharge Diagnoses:  Active Problems:   NST (non-stress test) nonreactive  Procedures: A NST procedure was performed with FHR monitoring and a normal baseline established, appropriate time of 20-40 minutes of evaluation, and accels >2 seen w 15x15 characteristics.  Results show a REACTIVE NST.   Discharge Condition: good  Disposition: 01-Home or Self Care  Diet: Regular diet  Discharge Activity: Activity as tolerated   Allergies as of 04/25/2016      Reactions   Sulfa Antibiotics Rash      Medication List    TAKE these medications   FLUoxetine 10 MG tablet Commonly known as:  PROZAC Take 10 mg by mouth daily.   lamoTRIgine 100 MG tablet Commonly known as:  LAMICTAL Take 100 mg by mouth daily.   multivitamin-prenatal 27-0.8 MG Tabs tablet Take 1 tablet by mouth daily at 12 noon.   ondansetron 4 MG disintegrating tablet Commonly known as:  ZOFRAN ODT Take 1 tablet (4 mg total) by mouth every 8 (eight) hours as needed for nausea or vomiting.   terconazole 0.8 % vaginal cream Commonly known as:  TERAZOL 3 Place 1 applicator vaginally at bedtime.   valACYclovir 500 MG tablet Commonly known as:  VALTREX Take 1,000 mg by mouth daily.   venlafaxine 37.5 MG tablet Commonly known as:  EFFEXOR Take 37.5 mg by mouth daily.      Plan IOL Mon pm for term plus SUA, cervical exam FT/50/-3 so plan for cervical ripening  Total time spent taking care of this patient: 30 minutes  Signed: [name] 04/25/2016, 7:54 PM

## 2016-04-25 NOTE — OB Triage Note (Signed)
Patient discharged after reactive NST and cervical check by Dr. Tiburcio PeaHarris. Discharge instructions reviewed with patient, patient verbalizes understanding, denies needs at this time. Patient ambulatory off the unit in good condition.

## 2016-04-25 NOTE — Discharge Summary (Signed)
  See FPN 

## 2016-04-30 ENCOUNTER — Inpatient Hospital Stay
Admission: EM | Admit: 2016-04-30 | Discharge: 2016-05-03 | DRG: 767 | Disposition: A | Discharge status: Home / Self Care | Payer: Managed Care, Other (non HMO) | Attending: Obstetrics and Gynecology | Admitting: Obstetrics and Gynecology

## 2016-04-30 DIAGNOSIS — O9081 Anemia of the puerperium: Secondary | ICD-10-CM | POA: Diagnosis not present

## 2016-04-30 DIAGNOSIS — Z302 Encounter for sterilization: Secondary | ICD-10-CM | POA: Diagnosis not present

## 2016-04-30 DIAGNOSIS — Z3A39 39 weeks gestation of pregnancy: Secondary | ICD-10-CM | POA: Diagnosis not present

## 2016-04-30 DIAGNOSIS — O0993 Supervision of high risk pregnancy, unspecified, third trimester: Secondary | ICD-10-CM

## 2016-04-30 DIAGNOSIS — Q270 Congenital absence and hypoplasia of umbilical artery: Secondary | ICD-10-CM

## 2016-04-30 LAB — TYPE AND SCREEN
ABO/RH(D): A POS
ANTIBODY SCREEN: NEGATIVE

## 2016-04-30 LAB — CBC
HEMATOCRIT: 34 % — AB (ref 35.0–47.0)
Hemoglobin: 11.8 g/dL — ABNORMAL LOW (ref 12.0–16.0)
MCH: 28.8 pg (ref 26.0–34.0)
MCHC: 34.6 g/dL (ref 32.0–36.0)
MCV: 83.1 fL (ref 80.0–100.0)
Platelets: 307 10*3/uL (ref 150–440)
RBC: 4.09 MIL/uL (ref 3.80–5.20)
RDW: 13.1 % (ref 11.5–14.5)
WBC: 11.5 10*3/uL — ABNORMAL HIGH (ref 3.6–11.0)

## 2016-04-30 MED ORDER — ONDANSETRON HCL 4 MG/2ML IJ SOLN: 4.0000 mg | Freq: Four times a day (QID) | INTRAMUSCULAR | Status: DC | PRN

## 2016-04-30 MED ORDER — ZOLPIDEM TARTRATE 5 MG PO TABS
5.0000 mg | ORAL_TABLET | Freq: Every evening | ORAL | Status: DC | PRN
  Administered 2016-04-30: 5 mg via ORAL
  Filled 2016-04-30: qty 1

## 2016-04-30 MED ORDER — LACTATED RINGERS IV SOLN: 500.0000 mL | INTRAVENOUS | Status: DC | PRN

## 2016-04-30 MED ORDER — DINOPROSTONE 10 MG VA INST
10.0000 mg | VAGINAL_INSERT | Freq: Once | VAGINAL | Status: AC
  Administered 2016-04-30: 10 mg via VAGINAL
  Filled 2016-04-30: qty 1

## 2016-04-30 MED ORDER — LACTATED RINGERS IV SOLN
INTRAVENOUS | Status: DC
  Administered 2016-04-30 – 2016-05-01 (×4): via INTRAVENOUS

## 2016-04-30 MED ORDER — TERBUTALINE SULFATE 1 MG/ML IJ SOLN: 0.2500 mg | Freq: Once | INTRAMUSCULAR | Status: DC | PRN

## 2016-04-30 MED ORDER — OXYTOCIN 40 UNITS IN LACTATED RINGERS INFUSION - SIMPLE MED
2.5000 [IU]/h | INTRAVENOUS | Status: DC
  Administered 2016-05-01: 2.5 [IU]/h via INTRAVENOUS
  Filled 2016-04-30: qty 1000

## 2016-04-30 MED ORDER — BUTORPHANOL TARTRATE 1 MG/ML IJ SOLN
1.0000 mg | INTRAMUSCULAR | Status: DC | PRN
  Administered 2016-05-01 (×3): 1 mg via INTRAVENOUS
  Filled 2016-04-30 (×4): qty 1

## 2016-04-30 MED ORDER — ACETAMINOPHEN 325 MG PO TABS
650.0000 mg | ORAL_TABLET | ORAL | Status: DC | PRN
  Administered 2016-05-01: 650 mg via ORAL
  Filled 2016-04-30: qty 2

## 2016-04-30 MED ORDER — OXYTOCIN BOLUS FROM INFUSION: 500.0000 mL | Freq: Once | INTRAVENOUS | Status: DC

## 2016-04-30 NOTE — H&P (Signed)
History and Physical Interval Note:  04/30/2016 8:23 PM  Julie Moss  has presented today for INDUCTION OF LABOR (cervical ripening agents),  with the diagnosis of Single Umbilicle Artery. The various methods of treatment have been discussed with the patient and family. After consideration of risks, benefits and other options for treatment, the patient has consented to  Labor induction .  The patient's history has been reviewed, patient examined, no change in status, and is stable for induction as planned.  See H&P. I have reviewed the patient's chart and labs.  Questions were answered to the patient's satisfaction.    Letitia Libraobert Paul Caeli Linehan

## 2016-05-01 ENCOUNTER — Inpatient Hospital Stay: Payer: Managed Care, Other (non HMO) | Admitting: Anesthesiology

## 2016-05-01 MED ORDER — OXYCODONE-ACETAMINOPHEN 5-325 MG PO TABS
2.0000 | ORAL_TABLET | ORAL | Status: DC | PRN
  Administered 2016-05-02 – 2016-05-03 (×5): 2 via ORAL
  Filled 2016-05-01 (×5): qty 2

## 2016-05-01 MED ORDER — IBUPROFEN 600 MG PO TABS
600.0000 mg | ORAL_TABLET | Freq: Four times a day (QID) | ORAL | Status: DC
  Administered 2016-05-01 – 2016-05-03 (×7): 600 mg via ORAL
  Filled 2016-05-01 (×7): qty 1

## 2016-05-01 MED ORDER — LIDOCAINE-EPINEPHRINE (PF) 1.5 %-1:200000 IJ SOLN
INTRAMUSCULAR | Status: DC | PRN
  Administered 2016-05-01: 3 mL via EPIDURAL

## 2016-05-01 MED ORDER — FENTANYL 2.5 MCG/ML W/ROPIVACAINE 0.2% IN NS 100 ML EPIDURAL INFUSION (ARMC-ANES)
EPIDURAL | Status: DC | PRN
  Administered 2016-05-01: 9 mL/h via EPIDURAL

## 2016-05-01 MED ORDER — SIMETHICONE 80 MG PO CHEW
80.0000 mg | CHEWABLE_TABLET | ORAL | Status: DC | PRN
Start: 2016-05-01 — End: 2016-05-03

## 2016-05-01 MED ORDER — FENTANYL 2.5 MCG/ML W/ROPIVACAINE 0.2% IN NS 100 ML EPIDURAL INFUSION (ARMC-ANES)
EPIDURAL | Status: AC
  Filled 2016-05-01: qty 100

## 2016-05-01 MED ORDER — EPHEDRINE 5 MG/ML INJ
10.0000 mg | INTRAVENOUS | Status: DC | PRN
  Filled 2016-05-01: qty 2

## 2016-05-01 MED ORDER — SODIUM CHLORIDE 0.9% FLUSH
3.0000 mL | INTRAVENOUS | Status: DC | PRN
  Administered 2016-05-02 (×2): 3 mL via INTRAVENOUS
  Filled 2016-05-01 (×2): qty 3

## 2016-05-01 MED ORDER — ONDANSETRON HCL 4 MG PO TABS: 4.0000 mg | ORAL_TABLET | ORAL | Status: DC | PRN

## 2016-05-01 MED ORDER — DIBUCAINE 1 % RE OINT: 1.0000 "application " | TOPICAL_OINTMENT | RECTAL | Status: DC | PRN

## 2016-05-01 MED ORDER — OXYCODONE-ACETAMINOPHEN 5-325 MG PO TABS
1.0000 | ORAL_TABLET | ORAL | Status: DC | PRN
  Administered 2016-05-01 – 2016-05-02 (×2): 1 via ORAL
  Filled 2016-05-01 (×2): qty 1

## 2016-05-01 MED ORDER — OXYTOCIN 10 UNIT/ML IJ SOLN
INTRAMUSCULAR | Status: AC
  Filled 2016-05-01: qty 2

## 2016-05-01 MED ORDER — SODIUM CHLORIDE 0.9 % IV SOLN: 250.0000 mL | INTRAVENOUS | Status: DC | PRN

## 2016-05-01 MED ORDER — DIPHENHYDRAMINE HCL 50 MG/ML IJ SOLN: 12.5000 mg | INTRAMUSCULAR | Status: DC | PRN

## 2016-05-01 MED ORDER — ZOLPIDEM TARTRATE 5 MG PO TABS: 5.0000 mg | ORAL_TABLET | Freq: Every evening | ORAL | Status: DC | PRN

## 2016-05-01 MED ORDER — DIPHENHYDRAMINE HCL 25 MG PO CAPS: 25.0000 mg | ORAL_CAPSULE | Freq: Four times a day (QID) | ORAL | Status: DC | PRN

## 2016-05-01 MED ORDER — SENNOSIDES-DOCUSATE SODIUM 8.6-50 MG PO TABS
2.0000 | ORAL_TABLET | ORAL | Status: DC
  Administered 2016-05-01 – 2016-05-02 (×2): 2 via ORAL
  Filled 2016-05-01 (×3): qty 2

## 2016-05-01 MED ORDER — BUPIVACAINE HCL (PF) 0.25 % IJ SOLN
INTRAMUSCULAR | Status: DC | PRN
  Administered 2016-05-01: 4 mL via EPIDURAL
  Administered 2016-05-01: 5 mL via EPIDURAL
  Administered 2016-05-01: 4 mL via EPIDURAL

## 2016-05-01 MED ORDER — LIDOCAINE HCL (PF) 1 % IJ SOLN
INTRAMUSCULAR | Status: AC
  Filled 2016-05-01: qty 30

## 2016-05-01 MED ORDER — LACTATED RINGERS IV SOLN: INTRAVENOUS | Status: DC

## 2016-05-01 MED ORDER — ONDANSETRON HCL 4 MG/2ML IJ SOLN: 4.0000 mg | INTRAMUSCULAR | Status: DC | PRN

## 2016-05-01 MED ORDER — OXYTOCIN 40 UNITS IN LACTATED RINGERS INFUSION - SIMPLE MED
1.0000 m[IU]/min | INTRAVENOUS | Status: DC
  Administered 2016-05-01 (×2): 1 m[IU]/min via INTRAVENOUS

## 2016-05-01 MED ORDER — PHENYLEPHRINE 40 MCG/ML (10ML) SYRINGE FOR IV PUSH (FOR BLOOD PRESSURE SUPPORT)
80.0000 ug | PREFILLED_SYRINGE | INTRAVENOUS | Status: DC | PRN
  Filled 2016-05-01: qty 5

## 2016-05-01 MED ORDER — SODIUM CHLORIDE 0.9% FLUSH
3.0000 mL | Freq: Two times a day (BID) | INTRAVENOUS | Status: DC
  Administered 2016-05-02 – 2016-05-03 (×2): 3 mL via INTRAVENOUS

## 2016-05-01 MED ORDER — WITCH HAZEL-GLYCERIN EX PADS: 1.0000 "application " | MEDICATED_PAD | CUTANEOUS | Status: DC | PRN

## 2016-05-01 MED ORDER — LACTATED RINGERS IV SOLN: 500.0000 mL | Freq: Once | INTRAVENOUS | Status: DC

## 2016-05-01 MED ORDER — AMMONIA AROMATIC IN INHA
RESPIRATORY_TRACT | Status: AC
  Filled 2016-05-01: qty 10

## 2016-05-01 MED ORDER — BENZOCAINE-MENTHOL 20-0.5 % EX AERO
1.0000 "application " | INHALATION_SPRAY | CUTANEOUS | Status: DC | PRN
  Administered 2016-05-01: 1 via TOPICAL
  Filled 2016-05-01: qty 56

## 2016-05-01 MED ORDER — LIDOCAINE HCL (PF) 1 % IJ SOLN
INTRAMUSCULAR | Status: DC | PRN
  Administered 2016-05-01: 3 mL via SUBCUTANEOUS

## 2016-05-01 MED ORDER — FENTANYL 2.5 MCG/ML W/ROPIVACAINE 0.2% IN NS 100 ML EPIDURAL INFUSION (ARMC-ANES): 9.0000 mL/h | EPIDURAL | Status: DC

## 2016-05-01 MED ORDER — COCONUT OIL OIL: 1.0000 "application " | TOPICAL_OIL | Status: DC | PRN

## 2016-05-01 MED ORDER — TERBUTALINE SULFATE 1 MG/ML IJ SOLN: 0.2500 mg | Freq: Once | INTRAMUSCULAR | Status: DC | PRN

## 2016-05-01 MED ORDER — ACETAMINOPHEN 325 MG PO TABS: 650.0000 mg | ORAL_TABLET | ORAL | Status: DC | PRN

## 2016-05-01 MED ORDER — MISOPROSTOL 200 MCG PO TABS
ORAL_TABLET | ORAL | Status: AC
  Filled 2016-05-01: qty 4

## 2016-05-01 NOTE — Anesthesia Preprocedure Evaluation (Signed)
Anesthesia Evaluation  Patient identified by MRN, date of birth, ID band Patient awake    Reviewed: Allergy & Precautions, H&P , NPO status , Patient's Chart, lab work & pertinent test results, reviewed documented beta blocker date and time   Airway Mallampati: II  TM Distance: >3 FB Neck ROM: full    Dental no notable dental hx. (+) Teeth Intact   Pulmonary neg pulmonary ROS, Current Smoker,    Pulmonary exam normal breath sounds clear to auscultation       Cardiovascular Exercise Tolerance: Good negative cardio ROS   Rhythm:regular Rate:Normal     Neuro/Psych PSYCHIATRIC DISORDERS negative neurological ROS  negative psych ROS   GI/Hepatic negative GI ROS, Neg liver ROS,   Endo/Other  negative endocrine ROSdiabetes  Renal/GU      Musculoskeletal   Abdominal   Peds  Hematology negative hematology ROS (+)   Anesthesia Other Findings   Reproductive/Obstetrics (+) Pregnancy                             Anesthesia Physical Anesthesia Plan  ASA: II  Anesthesia Plan: Epidural   Post-op Pain Management:    Induction:   Airway Management Planned:   Additional Equipment:   Intra-op Plan:   Post-operative Plan:   Informed Consent: I have reviewed the patients History and Physical, chart, labs and discussed the procedure including the risks, benefits and alternatives for the proposed anesthesia with the patient or authorized representative who has indicated his/her understanding and acceptance.     Plan Discussed with:   Anesthesia Plan Comments:         Anesthesia Quick Evaluation

## 2016-05-01 NOTE — Discharge Summary (Signed)
Obstetrical Discharge Summary  Date of Admission: 04/30/2016 Date of Discharge: 05/03/2016  Discharge Diagnosis: Term Pregnancy-delivered, sterilization Primary OB:  [facility]   Gestational Age at Delivery: 1424w0d  Antepartum complications: SUA Date of Delivery: 05/03/2016  Delivered By: Annamarie MajorPaul Harris, MD  Delivery Note Primary OB: [facility] Delivery Physician: Annamarie MajorPaul Harris, MD Gestational Age: Full term Antepartum complications: Single Umbilical Artery Intrapartum complications: None  A viable Female was delivered via vertex perentation.  Apgars:8 ,9  Weight:  pending .   Placenta status: spontaneous and Intact.  Cord: 3+ vessels;  with the following complications: nuchal.  Anesthesia:  epidural Episiotomy:  none Lacerations:  none Suture Repair: none Est. Blood Loss (mL):  less than 100 mL  Mom to postpartum.  Baby to Couplet care / Skin to Skin.  Post partum course: Since the delivery, patient has tolerate activity, diet, and daily functions without difficulty or complication.  Min lochia.  No breast concerns at this time.  No signs of depression currently. She had a postpartum tubal ligation on PPD#2. At the time of discharge her pain was well controlled on PO pain medication. She was ambulating, voiding and tolerating PO.   BP 111/66 (BP Location: Right Arm)   Pulse (!) 51   Temp 97.9 F (36.6 C) (Oral)   Resp 18   Ht 5\' 1"  (1.549 m)   Wt 150 lb (68 kg)   LMP 08/02/2015 (LMP Unknown)   SpO2 100%   Breastfeeding? Unknown   BMI 28.34 kg/m   Postpartum Exam:General appearance: alert, cooperative and no distress GI: soft, non-tender; bowel sounds normal; no masses,  no organomegaly and Fundus firm Extremities: extremities normal, atraumatic, no cyanosis or edema and no edema, redness or tenderness in the calves or thighs  Disposition: home with infant Rh Immune globulin given: no Rubella vaccine given: no Varicella vaccine given: no Tdap vaccine given in AP or PP  setting: given during prenatal care Flu vaccine given in AP or PP setting: given during prenatal care Contraception: bilateral tubal ligation  Prenatal Labs: A POS//Rubella Immune//RPR negative//HIV negative/HepB Surface Ag negative//plans to breastfeed  Plan:  Julie Moss was discharged to home in good condition. [facility] weeks  Discharge Medications: Allergies as of 05/03/2016      Reactions   Sulfa Antibiotics Rash      Medication List    STOP taking these medications   FLUoxetine 10 MG tablet Commonly known as:  PROZAC   lamoTRIgine 100 MG tablet Commonly known as:  LAMICTAL   ondansetron 4 MG disintegrating tablet Commonly known as:  ZOFRAN ODT   terconazole 0.8 % vaginal cream Commonly known as:  TERAZOL 3   valACYclovir 500 MG tablet Commonly known as:  VALTREX   venlafaxine 37.5 MG tablet Commonly known as:  EFFEXOR     TAKE these medications   ibuprofen 600 MG tablet Commonly known as:  ADVIL,MOTRIN Take 1 tablet (600 mg total) by mouth every 6 (six) hours. Start taking on:  05/04/2016   multivitamin-prenatal 27-0.8 MG Tabs tablet Take 1 tablet by mouth daily at 12 noon.   oxyCODONE-acetaminophen 5-325 MG tablet Commonly known as:  PERCOCET/ROXICET Take 2 tablets by mouth every 6 (six) hours as needed (Pain).       Follow-up arrangements:  Follow-up Information    Letitia Libraobert Paul Harris, MD. Schedule an appointment as soon as possible for a visit in 2 week(s).   Specialty:  Obstetrics and Gynecology Why:  pp depression check  Contact information: [address] [place] [address] [phone]           Thomasene MohairStephen Harumi Yamin, MD 05/03/2016 6:47 PM

## 2016-05-01 NOTE — Discharge Instructions (Signed)

## 2016-05-01 NOTE — Plan of Care (Signed)
Pt up to bedside commode to void. Right leg still weak and unable to ambulate to bathroom per pt.  Pt has no pain at this time.  Report called [facility] Consuella LoseElaine, [place] [facility] condition.  Ellison Carwin Savonna Birchmeier RNC

## 2016-05-01 NOTE — Progress Notes (Signed)
  Labor Progress Note   25 y.o. [id number] @ 375w0d , admitted for  Pregnancy, Labor Management. SUA IOL.  Subjective:  Ctxs moderate, Stadol one dose given, Cervadil removed one hour ago.  Objective:  BP 112/73 (BP Location: Left Arm)   Pulse 69   Temp 97.9 F (36.6 C)   Resp 18   Ht 5\' 1"  (1.549 m)   Wt 150 lb (68 kg)   LMP 08/02/2015 (LMP Unknown)   BMI 28.34 kg/m  Abd: mild Extr: trace to 1+ bilateral pedal edema SVE: CERVIX: 3-4 cm dilated, 70 effaced, -3 station  EFM: FHR: 140 bpm, variability: moderate,  accelerations:  Present,  decelerations:  Absent Toco: Frequency: Every 2 minutes Labs: I have reviewed the patient's lab results.   Assessment & Plan:  [id number] @ 2575w0d, admitted for  Pregnancy and Labor/Delivery Management  1. Pain management: IV sedation. 2. FWB: FHT category 1.  3. ID: GBS negative 4. Labor management: Monitor ctxs and start Pitocin if needed.  AROM later, after Pitocin or after cervical change.  Stadol, does not want epidural. All discussed with patient, see orders

## 2016-05-01 NOTE — Anesthesia Procedure Notes (Addendum)
Epidural  Start time: 05/01/2016 2:01 PM  Staffing Anesthesiologist: Yevette EdwardsADAMS, JAMES G Resident/CRNA: Malva CoganBEANE, CATHERINE Performed: resident/CRNA   Preanesthetic Checklist Completed: patient identified, site marked, surgical consent, pre-op evaluation, IV checked, risks and benefits discussed and monitors and equipment checked  Epidural Patient position: sitting Prep: Betadine Patient monitoring: heart rate, continuous pulse ox and blood pressure Approach: midline Location: L3-L4 Injection technique: LOR saline  Needle:  Needle type: Tuohy  Needle gauge: 17 G Needle length: 9 cm Needle insertion depth: 6 cm Catheter type: closed end flexible Catheter size: 19 Gauge Catheter at skin depth: 12 cm Test dose: negative and 1.5% lidocaine with Epi 1:200 K  Assessment Events: blood not aspirated, injection not painful, no injection resistance, negative IV test and no paresthesia  Additional Notes Reason for block:procedure for pain

## 2016-05-01 NOTE — Progress Notes (Signed)
  Labor Progress Note   25 y.o. [id number] @ 7552w0d , admitted for  Pregnancy, Labor Management. SUA IOL.  Subjective:  Ctxs moderate, Stadol for pain. Pitocin on 3 mU/min with ctxs every 1-2 min.  Objective:  BP 112/73 (BP Location: Left Arm)   Pulse 69   Temp 97.9 F (36.6 C)   Resp 18   Ht 5\' 1"  (1.549 m)   Wt 150 lb (68 kg)   LMP 08/02/2015 (LMP Unknown)   BMI 28.34 kg/m  Abd: mod Extr: trace to 1+ bilateral pedal edema SVE: CERVIX: 4 cm dilated, 70 effaced, -3 station AROM clear EFM: FHR: 140 bpm, variability: moderate,  accelerations:  Present,  decelerations:  Absent Toco: Frequency: Every 1-2 minutes Labs: I have reviewed the patient's lab results.   Assessment & Plan:  [id number] @ 8452w0d, admitted for  Pregnancy and Labor/Delivery Management  1. Pain management: IV sedation. 2. FWB: FHT category 1.  3. ID: GBS negative 4. Labor management: Monitor ctxs and cont Pitocin. AROM clear.  Stadol, does not want epidural. All discussed with patient, see orders

## 2016-05-01 NOTE — Progress Notes (Signed)
  Labor Progress Note   25 y.o. [id number] @ 2829w0d , admitted for  Pregnancy, Labor Management. SUA IOL.  Subjective:  Doing well after epidural.  Objective:  BP 124/78   Pulse 62   Temp 97.9 F (36.6 C)   Resp 18   Ht 5\' 1"  (1.549 m)   Wt 150 lb (68 kg)   LMP 08/02/2015 (LMP Unknown)   SpO2 100%   BMI 28.34 kg/m   Abd: mod Extr: trace to 1+ bilateral pedal edema SVE: CERVIX: 9+cm dilated, 90 effaced, 0 station  EFM: FHR: 120 bpm, variability: moderate,  accelerations:  Present,  decelerations:  Absent Toco: Frequency: Every 1-2 minutes Labs: I have reviewed the patient's lab results.  Assessment & Plan:  [id number] @ 6229w0d, admitted for  Pregnancy and Labor/Delivery Management  1. Pain management: IV sedation. 2. FWB: FHT category 1.  3. ID: GBS negative 4. Labor management: Monitor ctxs and cont Pitocin.  Pushing soon.   All discussed with patient, see orders

## 2016-05-02 LAB — RPR: RPR: NONREACTIVE

## 2016-05-02 LAB — CBC
HEMATOCRIT: 30.5 % — AB (ref 35.0–47.0)
Hemoglobin: 10.6 g/dL — ABNORMAL LOW (ref 12.0–16.0)
MCH: 29.2 pg (ref 26.0–34.0)
MCHC: 34.9 g/dL (ref 32.0–36.0)
MCV: 83.9 fL (ref 80.0–100.0)
PLATELETS: 271 10*3/uL (ref 150–440)
RBC: 3.64 MIL/uL — ABNORMAL LOW (ref 3.80–5.20)
RDW: 13.5 % (ref 11.5–14.5)
WBC: 14.6 10*3/uL — AB (ref 3.6–11.0)

## 2016-05-02 NOTE — Progress Notes (Signed)
  Postpartum Day 1  Subjective: no complaints, up ad lib, voiding and tolerating PO  Objective: Blood pressure 96/65, pulse 60, temperature 98 F (36.7 C), temperature source Oral, resp. rate 18, height 5\' 1"  (1.549 m), weight 150 lb (68 kg), last menstrual period 08/02/2015, SpO2 99 %  Physical Exam:  General: alert and cooperative Lochia: appropriate Uterine Fundus: firm Incision: N/A DVT Evaluation: No evidence of DVT seen on physical exam. Abdomen: soft, NT   Recent Labs  04/30/16 2221 05/02/16 0541  HGB 11.8* 10.6*  HCT 34.0* 30.5*    Assessment PPD #1  Plan: Continue PP care  Feeding: bottle Contraception: BTL - posted for tomorrow Blood Type: A+ RI/VI TDAP considering PP FLU - UTD   Marta AntuBrothers, Lakela Kuba, CNM 05/02/2016, 9:58 AM

## 2016-05-02 NOTE — Anesthesia Postprocedure Evaluation (Signed)
Anesthesia Post Note  Patient: Julie Moss  Procedure(s) Performed: * No procedures listed *  Patient location during evaluation: Mother Baby Anesthesia Type: Spinal Level of consciousness: awake and alert and oriented Pain management: satisfactory to patient Vital Signs Assessment: post-procedure vital signs reviewed and stable Respiratory status: respiratory function stable Cardiovascular status: blood pressure returned to baseline Postop Assessment: no headache, no backache, epidural receding, no signs of nausea or vomiting, patient able to bend at knees and adequate PO intake Anesthetic complications: no     Last Vitals:  Vitals:   05/01/16 2059 05/02/16 0402  BP: 120/80 (!) 97/51  Pulse: 70 (!) 59  Resp: 20 20  Temp: 36.6 C 36.7 C    Last Pain:  Vitals:   05/02/16 0550  TempSrc:   PainSc: 8                  Clydene PughBeane, Maple Flinders D
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
## 2016-05-03 ENCOUNTER — Encounter
Admission: EM | Disposition: A | Discharge status: Home / Self Care | Payer: Self-pay | Source: Home / Self Care | Attending: Obstetrics and Gynecology

## 2016-05-03 ENCOUNTER — Inpatient Hospital Stay: Payer: Managed Care, Other (non HMO) | Admitting: Anesthesiology

## 2016-05-03 DIAGNOSIS — Z302 Encounter for sterilization: Secondary | ICD-10-CM

## 2016-05-03 HISTORY — PX: TUBAL LIGATION: SHX77

## 2016-05-03 SURGERY — LIGATION, FALLOPIAN TUBE, POSTPARTUM
Anesthesia: Spinal

## 2016-05-03 MED ORDER — HYDROMORPHONE HCL 1 MG/ML IJ SOLN
1.0000 mg | INTRAMUSCULAR | Status: DC | PRN
Start: 2016-05-03 — End: 2016-05-03
  Administered 2016-05-03: 1 mg via INTRAVENOUS
  Filled 2016-05-03: qty 1

## 2016-05-03 MED ORDER — ONDANSETRON HCL 4 MG/2ML IJ SOLN
INTRAMUSCULAR | Status: AC
  Filled 2016-05-03: qty 2

## 2016-05-03 MED ORDER — OXYCODONE HCL 5 MG PO TABS: 5.0000 mg | ORAL_TABLET | Freq: Once | ORAL | Status: DC | PRN

## 2016-05-03 MED ORDER — BUPIVACAINE-EPINEPHRINE 0.25% -1:200000 IJ SOLN
INTRAMUSCULAR | Status: DC | PRN
  Administered 2016-05-03: 10 mL

## 2016-05-03 MED ORDER — FENTANYL CITRATE (PF) 250 MCG/5ML IJ SOLN
INTRAMUSCULAR | Status: AC
  Filled 2016-05-03: qty 5

## 2016-05-03 MED ORDER — MENTHOL 3 MG MT LOZG
1.0000 | LOZENGE | OROMUCOSAL | Status: DC | PRN
  Filled 2016-05-03: qty 9

## 2016-05-03 MED ORDER — PROMETHAZINE HCL 25 MG/ML IJ SOLN: 6.2500 mg | INTRAMUSCULAR | Status: DC | PRN

## 2016-05-03 MED ORDER — LIDOCAINE 2% (20 MG/ML) 5 ML SYRINGE
INTRAMUSCULAR | Status: AC
  Filled 2016-05-03: qty 5

## 2016-05-03 MED ORDER — MIDAZOLAM HCL 2 MG/2ML IJ SOLN
INTRAMUSCULAR | Status: AC
  Filled 2016-05-03: qty 2

## 2016-05-03 MED ORDER — MEPERIDINE HCL 25 MG/ML IJ SOLN: 6.2500 mg | INTRAMUSCULAR | Status: DC | PRN

## 2016-05-03 MED ORDER — BUPIVACAINE IN DEXTROSE 0.75-8.25 % IT SOLN
INTRATHECAL | Status: DC | PRN
  Administered 2016-05-03: 2 mL via INTRATHECAL

## 2016-05-03 MED ORDER — OXYCODONE HCL 5 MG/5ML PO SOLN: 5.0000 mg | Freq: Once | ORAL | Status: DC | PRN

## 2016-05-03 MED ORDER — LACTATED RINGERS IV SOLN
INTRAVENOUS | Status: DC
  Administered 2016-05-03: 18:00:00 via INTRAVENOUS

## 2016-05-03 MED ORDER — IBUPROFEN 600 MG PO TABS: 600.0000 mg | ORAL_TABLET | Freq: Four times a day (QID) | ORAL | 0 refills | Status: DC

## 2016-05-03 MED ORDER — LACTATED RINGERS IV SOLN
INTRAVENOUS | Status: DC
  Administered 2016-05-03: 12:00:00 via INTRAVENOUS

## 2016-05-03 MED ORDER — ONDANSETRON HCL 4 MG/2ML IJ SOLN
INTRAMUSCULAR | Status: DC | PRN
  Administered 2016-05-03: 4 mg via INTRAVENOUS

## 2016-05-03 MED ORDER — PROPOFOL 10 MG/ML IV BOLUS
INTRAVENOUS | Status: AC
  Filled 2016-05-03: qty 20

## 2016-05-03 MED ORDER — FENTANYL CITRATE (PF) 100 MCG/2ML IJ SOLN
INTRAMUSCULAR | Status: DC | PRN
  Administered 2016-05-03 (×3): 50 ug via INTRAVENOUS

## 2016-05-03 MED ORDER — FENTANYL CITRATE (PF) 100 MCG/2ML IJ SOLN
INTRAMUSCULAR | Status: AC
  Filled 2016-05-03: qty 2

## 2016-05-03 MED ORDER — MIDAZOLAM HCL 5 MG/5ML IJ SOLN
INTRAMUSCULAR | Status: DC | PRN
  Administered 2016-05-03 (×2): 2 mg via INTRAVENOUS

## 2016-05-03 MED ORDER — OXYCODONE-ACETAMINOPHEN 5-325 MG PO TABS: 2.0000 | ORAL_TABLET | Freq: Four times a day (QID) | ORAL | 0 refills | Status: DC | PRN

## 2016-05-03 MED ORDER — FENTANYL CITRATE (PF) 100 MCG/2ML IJ SOLN
25.0000 ug | INTRAMUSCULAR | Status: DC | PRN
  Administered 2016-05-03 (×2): 50 ug via INTRAVENOUS

## 2016-05-03 MED ORDER — PROPOFOL 10 MG/ML IV BOLUS
INTRAVENOUS | Status: AC
  Filled 2016-05-03: qty 40

## 2016-05-03 SURGICAL SUPPLY — 24 items
BLADE SURG SZ11 CARB STEEL (BLADE) ×2 IMPLANT
CHLORAPREP W/TINT 26ML (MISCELLANEOUS) ×2 IMPLANT
DRAPE LAPAROTOMY 100X77 ABD (DRAPES) ×2 IMPLANT
ELECT CAUTERY BLADE 6.4 (BLADE) ×2 IMPLANT
ELECT REM PT RETURN 9FT ADLT (ELECTROSURGICAL) ×2
ELECTRODE REM PT RTRN 9FT ADLT (ELECTROSURGICAL) ×1 IMPLANT
GLOVE BIO SURGEON STRL SZ7 (GLOVE) ×2 IMPLANT
GLOVE BIOGEL PI IND STRL 7.5 (GLOVE) ×1 IMPLANT
GLOVE BIOGEL PI INDICATOR 7.5 (GLOVE) ×1
GOWN STRL REUS W/ TWL LRG LVL3 (GOWN DISPOSABLE) ×2 IMPLANT
GOWN STRL REUS W/ TWL XL LVL3 (GOWN DISPOSABLE) ×1 IMPLANT
GOWN STRL REUS W/TWL LRG LVL3 (GOWN DISPOSABLE) ×2
GOWN STRL REUS W/TWL XL LVL3 (GOWN DISPOSABLE) ×1
KIT RM TURNOVER CYSTO AR (KITS) ×2 IMPLANT
LABEL OR SOLS (LABEL) ×2 IMPLANT
LIQUID BAND (GAUZE/BANDAGES/DRESSINGS) ×2 IMPLANT
NDL SAFETY 22GX1.5 (NEEDLE) ×2 IMPLANT
NS IRRIG 500ML POUR BTL (IV SOLUTION) ×2 IMPLANT
PACK BASIN MINOR ARMC (MISCELLANEOUS) ×2 IMPLANT
SUT PLAIN GUT 0 (SUTURE) ×4 IMPLANT
SUT VIC AB 2-0 UR6 27 (SUTURE) ×2 IMPLANT
SUT VIC AB 3-0 SH 27 (SUTURE) ×1
SUT VIC AB 3-0 SH 27X BRD (SUTURE) ×1 IMPLANT
SYRINGE 10CC LL (SYRINGE) ×2 IMPLANT

## 2016-05-03 NOTE — Transfer of Care (Signed)
Immediate Anesthesia Transfer of Care Note  Patient: Julie Moss  Procedure(s) Performed: Procedure(s): POST PARTUM TUBAL LIGATION (N/A)  Patient Location: PACU  Anesthesia Type:Spinal  Level of Consciousness: awake, alert  and oriented  Airway & Oxygen Therapy: Patient connected to face mask oxygen  Post-op Assessment: Report given to RN and Post -op Vital signs reviewed and stable  Post vital signs: Reviewed and stable  Last Vitals:  Vitals:   05/03/16 0801 05/03/16 1130  BP: (!) 95/51 111/83  Pulse: (!) 55 61  Resp: 20 16  Temp: 36.9 C 36.5 C    Last Pain:  Vitals:   05/03/16 0801  TempSrc: Oral  PainSc:      SAB at T-8    Complications: No apparent anesthesia complications

## 2016-05-03 NOTE — Progress Notes (Signed)
Patient ID: Lynnda ShieldsBrittany S Allum, female   DOB: October 08, 1990, 25 y.o.   MRN: [id number]  Obstetric Postpartum Daily Progress Note Subjective:  25 y.o. [id number] postpartum day #2 status post vaginal delivery.  She is ambulating, is tolerating po but has been NPO since midnight for surgery today, is voiding spontaneously.  Her pain is well controlled on PO pain medications. Her lochia is less than menses.   Medications SCHEDULED MEDICATIONS  . ibuprofen  600 mg Oral Q6H  . senna-docusate  2 tablet Oral Q24H  . sodium chloride flush  3 mL Intravenous Q12H    MEDICATION INFUSIONS  . lactated ringers      PRN MEDICATIONS  sodium chloride flush **AND** sodium chloride flush **AND** sodium chloride, acetaminophen, benzocaine-Menthol, coconut oil, witch hazel-glycerin **AND** dibucaine, diphenhydrAMINE, ondansetron **OR** ondansetron (ZOFRAN) IV, oxyCODONE-acetaminophen, oxyCODONE-acetaminophen, simethicone, zolpidem    Objective:   Vitals:   05/02/16 1632 05/02/16 1923 05/03/16 0057 05/03/16 0801  BP: 102/65 107/73 102/73 (!) 95/51  Pulse: 60 64 68 (!) 55  Resp: 18 20 18 20   Temp: 98.2 F (36.8 C) 98 F (36.7 C) 97.7 F (36.5 C) 98.5 F (36.9 C)  TempSrc: Oral Oral Oral Oral  SpO2:  98%    Weight:      Height:        Current Vital Signs 24h Vital Sign Ranges  T 98.5 F (36.9 C) Temp  Avg: 98.1 F (36.7 C)  Min: 97.7 F (36.5 C)  Max: 98.5 F (36.9 C)  BP (!) 95/51 BP  Min: 95/51  Max: 107/73  HR (!) 55 Pulse  Avg: 61.6  Min: 55  Max: 68  RR 20 Resp  Avg: 18.8  Min: 18  Max: 20  SaO2 98 % Not Delivered SpO2  Avg: 98 %  Min: 98 %  Max: 98 %       24 Hour I/O Current Shift I/O  Time Ins Outs No intake/output data recorded. No intake/output data recorded.  General: NAD Pulmonary: no increased work of breathing Abdomen: non-distended, non-tender, fundus firm at level of umbilicus Extremities: no edema, no erythema, no tenderness  Labs:   Recent Labs Lab 04/30/16 2221  05/02/16 0541  WBC 11.5* 14.6*  HGB 11.8* 10.6*  HCT 34.0* 30.5*  PLT 307 271     Assessment:   25 y.o. [id number] postpartum day # 1 status post SVD  Plan:   1) Acute blood loss anemia - hemodynamically stable and asymptomatic - po ferrous sulfate  2) A POS / Rubella  / Varicella Immune  3) TDAP status needs and is considering  4) formula feeding /Contraception = bilateral tubal ligation today.  She understands that there are alternatives to BTL that are not permanent and just as efficacious and potentially have better side effects. She understands that there is a failure rate to BTL of 3-5 per 1,000 procedures each year.  She understands that if she is to get pregnant, her risk of ectopic pregnancy is increased compared to if she had not had the procedure.  She would like to proceed.  5) Disposition: home later today after recovery from surgery.  Thomasene MohairStephen Braison Snoke, MD 05/03/2016 8:34 AM

## 2016-05-03 NOTE — OR Nursing (Signed)
Patient presents to unit IV site swollen will discontinue and restart floor RN made aware.

## 2016-05-03 NOTE — Anesthesia Procedure Notes (Signed)
Performed by: Tonia GhentOOK-MARTIN, Felisa Zechman Pre-anesthesia Checklist: Patient identified, Emergency Drugs available, Suction available, Patient being monitored and Timeout performed Patient Re-evaluated:Patient Re-evaluated prior to inductionOxygen Delivery Method: Simple face mask

## 2016-05-03 NOTE — Progress Notes (Signed)
Patient discharged home with infant. Vital signs stable, bleeding within normal limits, uterus firm. Discharge instructions, prescriptions, and follow up appointment given to and reviewed with patient. Patient verbalized understanding, all questions answered. Escorted in wheelchair by nursing. Ousman Dise, RN     

## 2016-05-03 NOTE — Op Note (Signed)
  Operative Note    Pre-Op Diagnosis: multiparity, desires permanent sterility  Post-Op Diagnosis: multiparity, desires permanent sterility  Procedures: Postpartum bilateral tubal ligation via Pomeroy method.  Primary Surgeon: Thomasene MohairStephen Vishaal Strollo, MD   EBL: 10 mL   IVF: 600 mL crystalloid  Specimens: portion of right and left fallopian tube for permanent  Drains: None  Complications: None   Disposition: PACU   Condition: Stable   Findings: normal appearing right and left fallopian tubes and ovaries   Indication: The patient is a [age] y.o. [id number] who is postpartum day 2 status post spontaneous vaginal delivery.  She has been counseled extensively regarding risks, benefits, and alternatives to tubal ligation, including non-permanent forms of contraception that are equivalent in efficacy with potentially better side effects.  She has been advised that there is a failure rate of 3-5 in every 1,000 tubal ligations per year with an increased risk of ectopic pregnancy should pregnancy occur.   Procedure Summary:  The patient was taken to the operating room where neuraxial anesthesia was administered and found to be adequate. After timeout was called a small transverse, infraumbilical incision was made with the scalpel. The incision was carried down through the fascia until the peritoneum was identified and entered. The peritoneum was noted to be free of any adhesions and the incision was then extended.  The patient's right fallopian tube was identified, brought incision, and grasped with a Babcock clamp. The tube was then followed out to the fimbria. The Babcock clamp was then used to grasp the tube approximately 4 cm from the cornual region. A 3 cm segment of tube was then ligated with the 2 free ties of plain gut, and excised. Good hemostasis was noted and the tube was returned to the abdomen. The left fallopian tube was then ligated, and a 3 cm segment excised in a similar fashion. Excellent  hemostasis was noted, and the tube returned to the abdomen.  The peritoneum and fascia were closed in a single layer using 0 Vicryl. The subcuticular tissue was reapproximated using 3-0 vicryl. The skin was closed in a subcuticular fashion using 4-0 monocryl. The closure was also closed with Dermabond.  A total of 16 mL of 0.25% sensorcaine with epinephrine was used in the case.   Sponge, lap, needle, and instrument counts were correct x 2.  VTE prophylaxis: SCDs. Antibiotic prophylaxis: none indicated. The patient tolerated the procedure well and was taken to the PACU in stable condition.   Thomasene MohairStephen Shanayah Kaffenberger, MD 05/03/2016 1:23 PM

## 2016-05-03 NOTE — Anesthesia Procedure Notes (Addendum)
Spinal  Patient location during procedure: OR Start time: 05/03/2016 12:22 PM End time: 05/03/2016 12:32 PM Staffing Anesthesiologist: Emmie Niemann Performed: anesthesiologist  Preanesthetic Checklist Completed: patient identified, site marked, surgical consent, pre-op evaluation, timeout performed, IV checked, risks and benefits discussed and monitors and equipment checked Spinal Block Patient position: sitting Prep: ChloraPrep Patient monitoring: heart rate, continuous pulse ox, blood pressure and cardiac monitor Approach: midline Location: L4-5 Injection technique: single-shot Needle Needle type: Introducer and Pencil-Tip  Needle gauge: 24 G Needle length: 9 cm Additional Notes L buttock parasthesia on aspiration after first pass, needle withdrawn and redirected, positive CSF flow and no parasthesia with aspiration. Negative blood return. Positive free-flowing CSF. Expiration date of kit checked and confirmed. Patient tolerated procedure well, without complications.

## 2016-05-03 NOTE — Anesthesia Preprocedure Evaluation (Signed)
Anesthesia Evaluation  Patient identified by MRN, date of birth, ID band Patient awake    Reviewed: Allergy & Precautions, NPO status , Patient's Chart, lab work & pertinent test results  History of Anesthesia Complications Negative for: history of anesthetic complications  Airway Mallampati: II  TM Distance: >3 FB Neck ROM: Full    Dental no notable dental hx.    Pulmonary neg pulmonary ROS, neg sleep apnea, neg COPD,    breath sounds clear to auscultation- rhonchi (-) wheezing      Cardiovascular Exercise Tolerance: Good (-) hypertension(-) CAD and (-) Past MI  Rhythm:Regular Rate:Normal - Systolic murmurs and - Diastolic murmurs    Neuro/Psych PSYCHIATRIC DISORDERS negative neurological ROS     GI/Hepatic negative GI ROS, Neg liver ROS,   Endo/Other  negative endocrine ROSneg diabetes  Renal/GU negative Renal ROS     Musculoskeletal negative musculoskeletal ROS (+)   Abdominal (+) - obese,   Peds  Hematology negative hematology ROS (+)   Anesthesia Other Findings Past Medical History: No date: Mood disorder (HCC) No date: Ovarian cyst   Reproductive/Obstetrics                             Anesthesia Physical Anesthesia Plan  ASA: II  Anesthesia Plan: Spinal   Post-op Pain Management:    Induction:   Airway Management Planned: Natural Airway  Additional Equipment:   Intra-op Plan:   Post-operative Plan:   Informed Consent: I have reviewed the patients History and Physical, chart, labs and discussed the procedure including the risks, benefits and alternatives for the proposed anesthesia with the patient or authorized representative who has indicated his/her understanding and acceptance.   Dental advisory given  Plan Discussed with: Anesthesiologist and CRNA  Anesthesia Plan Comments:         Lab Results  Component Value Date   WBC 14.6 (H) 05/02/2016   HGB  10.6 (L) 05/02/2016   HCT 30.5 (L) 05/02/2016   MCV 83.9 05/02/2016   PLT 271 05/02/2016    Anesthesia Quick Evaluation

## 2016-05-04 ENCOUNTER — Encounter: Payer: Self-pay | Admitting: Obstetrics and Gynecology

## 2016-05-04 LAB — SURGICAL PATHOLOGY

## 2016-05-08 NOTE — Anesthesia Postprocedure Evaluation (Signed)
Anesthesia Post Note  Patient: Julie Moss  Procedure(s) Performed: Procedure(s) (LRB): POST PARTUM TUBAL LIGATION (N/A)  Patient location during evaluation: PACU Anesthesia Type: Spinal Level of consciousness: oriented and awake and alert Pain management: pain level controlled Vital Signs Assessment: post-procedure vital signs reviewed and stable Respiratory status: spontaneous breathing, respiratory function stable and patient connected to nasal cannula oxygen Cardiovascular status: blood pressure returned to baseline and stable Postop Assessment: no headache and no backache Anesthetic complications: no     Last Vitals:  Vitals:   05/03/16 1409 05/03/16 1452  BP:  111/66  Pulse: (!) 59 (!) 51  Resp: 18   Temp: 36.3 C 36.6 C    Last Pain:  Vitals:   05/03/16 1615  TempSrc:   PainSc: 7                  Lenard SimmerAndrew Keeley Sussman

## 2016-05-11 ENCOUNTER — Other Ambulatory Visit: Payer: Self-pay | Admitting: Obstetrics and Gynecology

## 2016-05-11 ENCOUNTER — Ambulatory Visit
Admission: RE | Admit: 2016-05-11 | Discharge: 2016-05-11 | Disposition: A | Discharge status: Home / Self Care | Payer: Managed Care, Other (non HMO) | Source: Ambulatory Visit | Attending: Obstetrics and Gynecology | Admitting: Obstetrics and Gynecology

## 2016-05-11 DIAGNOSIS — R938 Abnormal findings on diagnostic imaging of other specified body structures: Secondary | ICD-10-CM | POA: Diagnosis not present

## 2016-05-11 DIAGNOSIS — G8918 Other acute postprocedural pain: Secondary | ICD-10-CM | POA: Diagnosis present

## 2016-05-11 DIAGNOSIS — R109 Unspecified abdominal pain: Secondary | ICD-10-CM | POA: Diagnosis not present

## 2016-05-11 DIAGNOSIS — R1031 Right lower quadrant pain: Secondary | ICD-10-CM | POA: Diagnosis not present

## 2016-05-11 DIAGNOSIS — K429 Umbilical hernia without obstruction or gangrene: Secondary | ICD-10-CM | POA: Diagnosis not present

## 2016-05-11 MED ORDER — IOPAMIDOL (ISOVUE-300) INJECTION 61%
100.0000 mL | Freq: Once | INTRAVENOUS | Status: AC | PRN
  Administered 2016-05-11: 100 mL via INTRAVENOUS

## 2016-12-29 ENCOUNTER — Emergency Department: Payer: Medicaid Other

## 2016-12-29 ENCOUNTER — Encounter: Payer: Self-pay | Admitting: Emergency Medicine

## 2016-12-29 ENCOUNTER — Emergency Department
Admission: EM | Admit: 2016-12-29 | Discharge: 2016-12-29 | Disposition: A | Discharge status: Home / Self Care | Payer: Medicaid Other | Attending: Emergency Medicine | Admitting: Emergency Medicine

## 2016-12-29 DIAGNOSIS — Z5321 Procedure and treatment not carried out due to patient leaving prior to being seen by health care provider: Secondary | ICD-10-CM | POA: Insufficient documentation

## 2016-12-29 DIAGNOSIS — R079 Chest pain, unspecified: Secondary | ICD-10-CM | POA: Insufficient documentation

## 2016-12-29 LAB — CBC
HCT: 38.9 % (ref 35.0–47.0)
Hemoglobin: 13.5 g/dL (ref 12.0–16.0)
MCH: 29.1 pg (ref 26.0–34.0)
MCHC: 34.8 g/dL (ref 32.0–36.0)
MCV: 83.7 fL (ref 80.0–100.0)
PLATELETS: 342 10*3/uL (ref 150–440)
RBC: 4.65 MIL/uL (ref 3.80–5.20)
RDW: 13.6 % (ref 11.5–14.5)
WBC: 10.9 10*3/uL (ref 3.6–11.0)

## 2016-12-29 LAB — BASIC METABOLIC PANEL
Anion gap: 9 (ref 5–15)
BUN: 11 mg/dL (ref 6–20)
CALCIUM: 9.8 mg/dL (ref 8.9–10.3)
CHLORIDE: 104 mmol/L (ref 101–111)
CO2: 23 mmol/L (ref 22–32)
CREATININE: 0.63 mg/dL (ref 0.44–1.00)
GFR calc Af Amer: >60 mL/min (ref >60)
GFR calc non Af Amer: >60 mL/min (ref >60)
Glucose, Bld: 88 mg/dL (ref 65–99)
Potassium: 3.4 mmol/L — ABNORMAL LOW (ref 3.5–5.1)
SODIUM: 136 mmol/L (ref 135–145)

## 2016-12-29 LAB — TROPONIN I

## 2016-12-29 NOTE — ED Triage Notes (Signed)
Pt states "my chest is killing me". Pt states pain began at 1500 today. Pt states she also has a headache. Pt denies shob, lightheadedness. Pt states she also has mid right sided back pain. Pt with flat affect and appears in no acute distress. Pt denies pain increase with movement however states when she takes a deep breath "it feels like something is going to pop in my chest".

## 2016-12-31 ENCOUNTER — Telehealth: Payer: Self-pay | Admitting: Emergency Medicine

## 2016-12-31 NOTE — Telephone Encounter (Signed)
Called patient due to lwot to inquire about condition and follow up plans.. Pt says she feels better.  I advised her to call her doctor and let them know about her symptoms she had and that labs are done here.  She says she does not have a doctor.  Says she no longer is insured, so not sure if she will be going to [place] [facility] anymore.  [facility].

## 2017-07-20 IMAGING — CT CT ABD-PELV W/ CM
2 of 5 series · 15 of 46 positions shown, 17 images · IV contrast (APPLIED)
Comparison: CT of the abdomen and pelvis 09/07/2013.

CLINICAL DATA: 25-year-old female with history of right lower
quadrant abdominal pain for the past 4 days. Status post recent
vaginal delivery and laparoscopic tubal ligation on 05/03/2016.

EXAM:
CT ABDOMEN AND PELVIS WITH CONTRAST
TECHNIQUE: Multidetector CT imaging of the abdomen and pelvis was performed
using the standard protocol following bolus administration of
intravenous contrast.
CONTRAST:  100mL [id number] IOPAMIDOL (9HS270-SEE) INJECTION 61%

[Series 2: routine abd/pel with · axial · 0.75mm/px · z∈[-932,-572]mm · 12 of 86 slices shown, 14 images]
[im 7/86  soft-tissue]
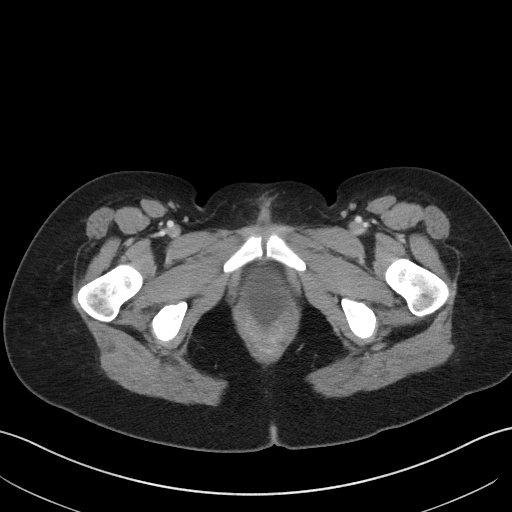
[im 7/86  bone]
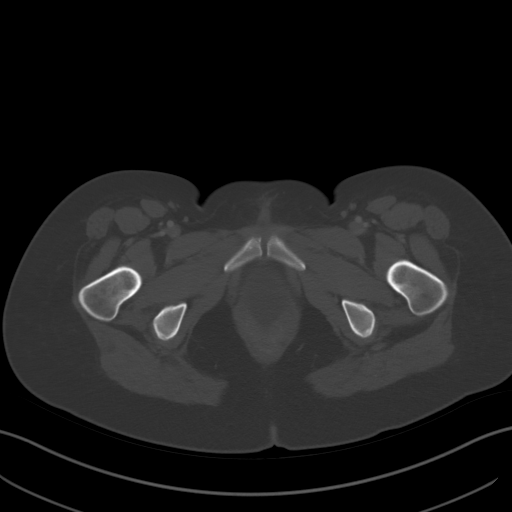
[im 13/86  soft-tissue]
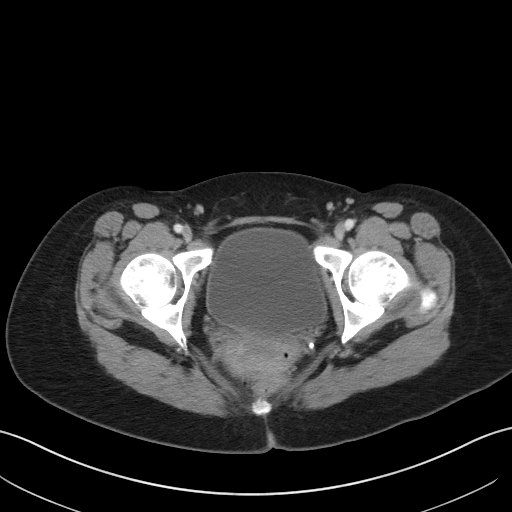
[im 19/86  soft-tissue]
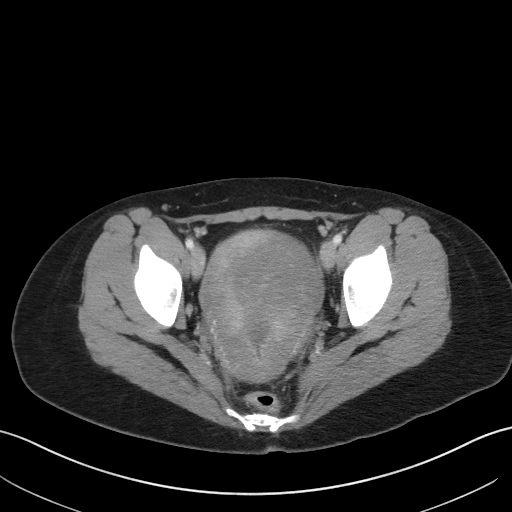
[im 25/86  soft-tissue]
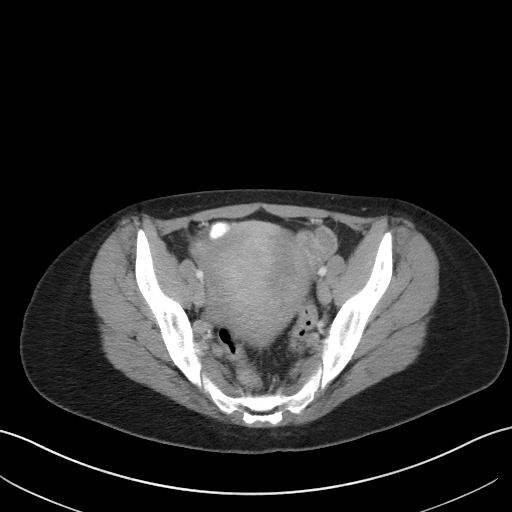
[im 31/86  soft-tissue]
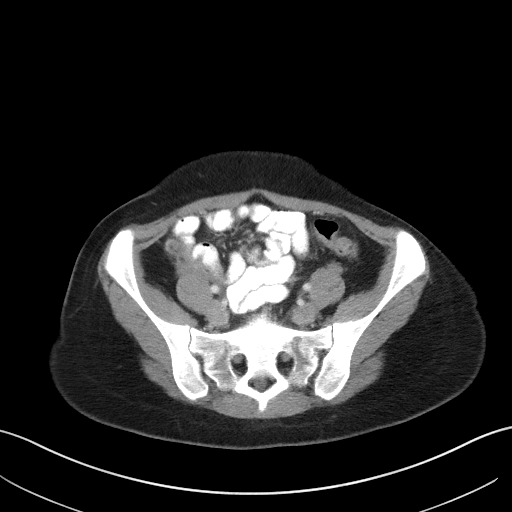
[im 37/86  soft-tissue]
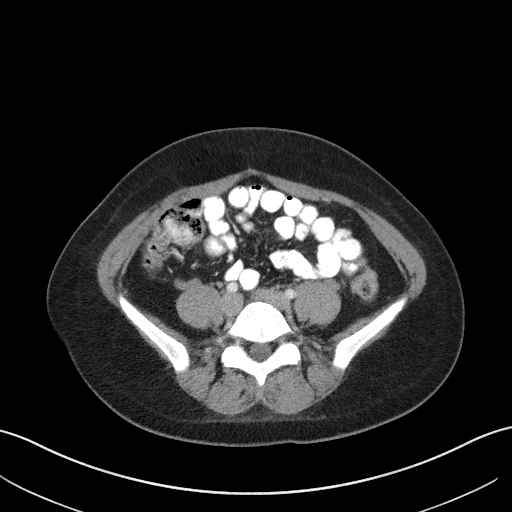
[im 49/86  soft-tissue]
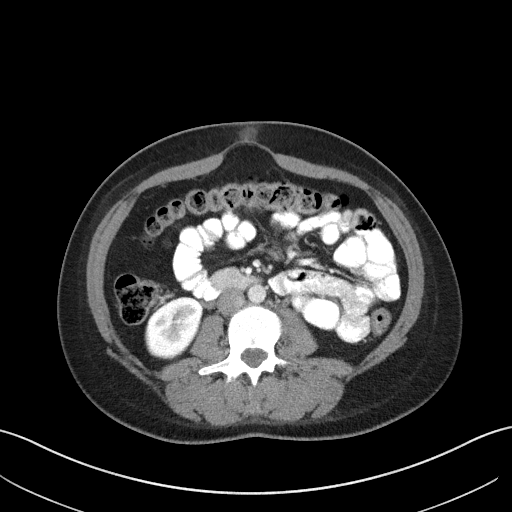
[im 55/86  soft-tissue]
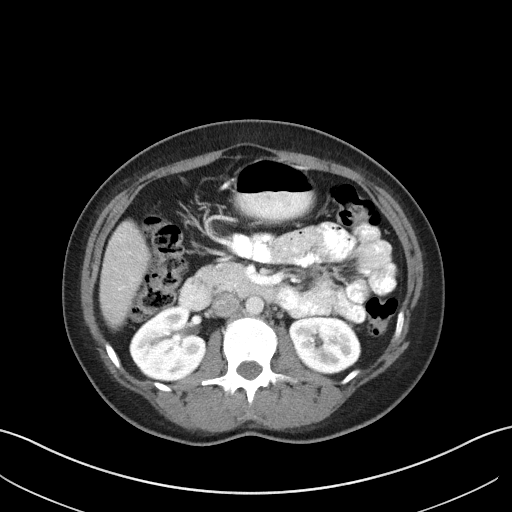
[im 61/86  soft-tissue]
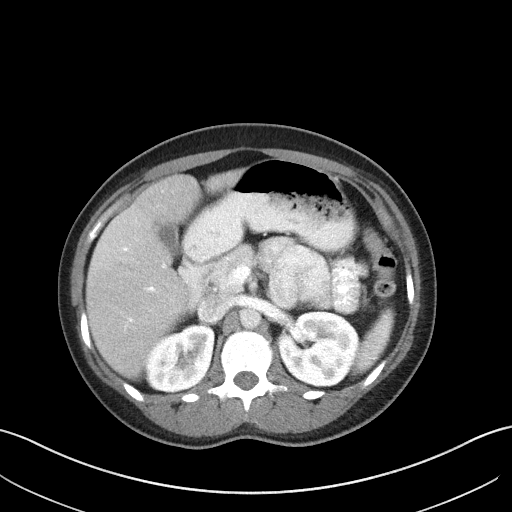
[im 61/86  bone]
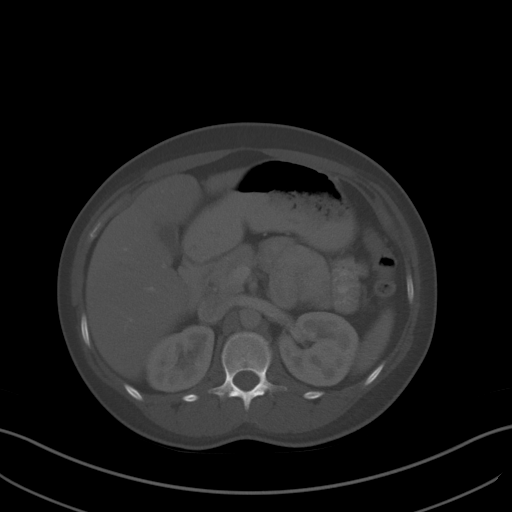
[im 67/86  soft-tissue]
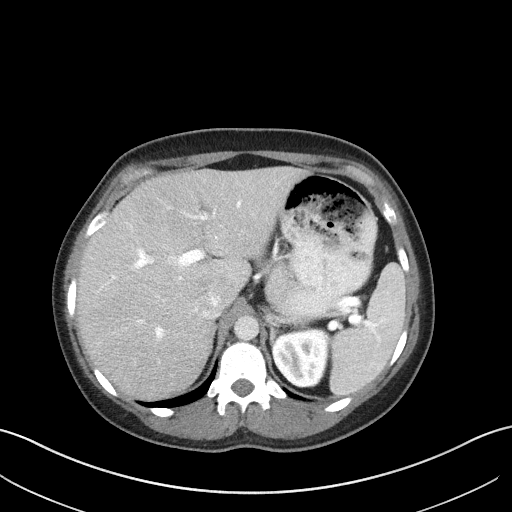
[im 73/86  soft-tissue]
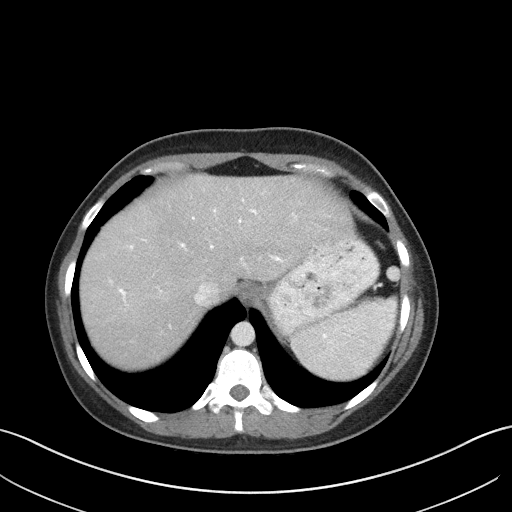
[im 79/86  soft-tissue]
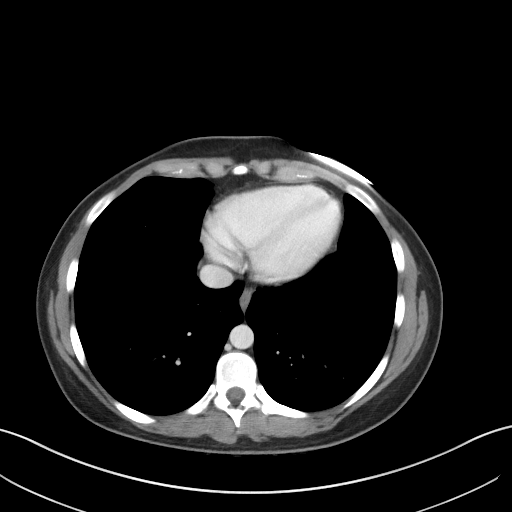

[Series 5: coronal st · coronal · 0.68mm/px · 3 of 81 slices shown]
[im 27/81  soft-tissue]
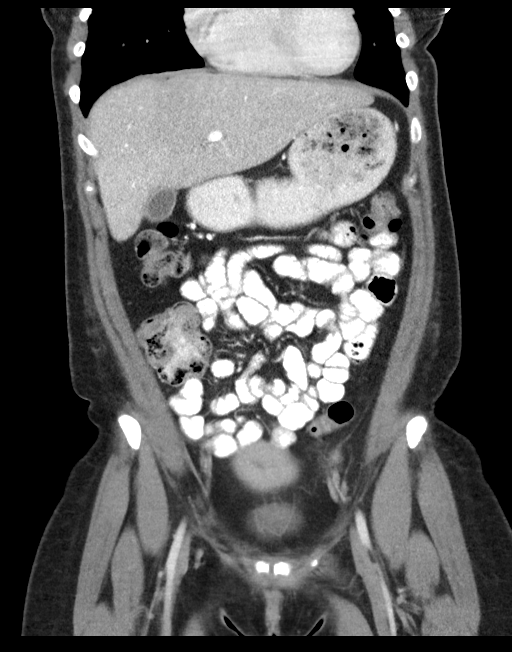
[im 36/81  soft-tissue]
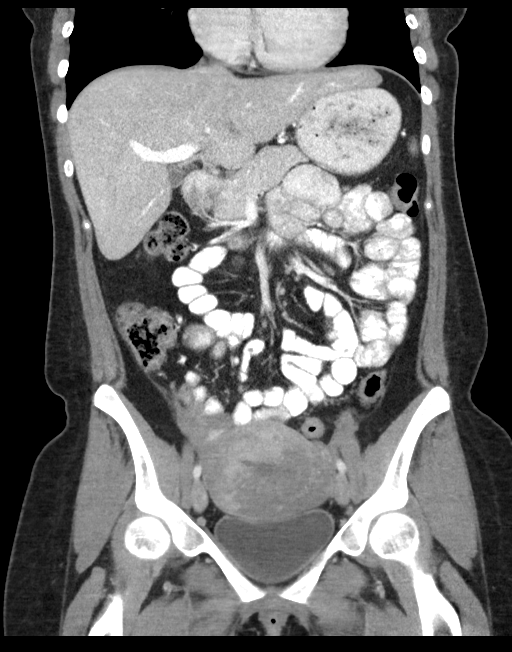
[im 45/81  soft-tissue]
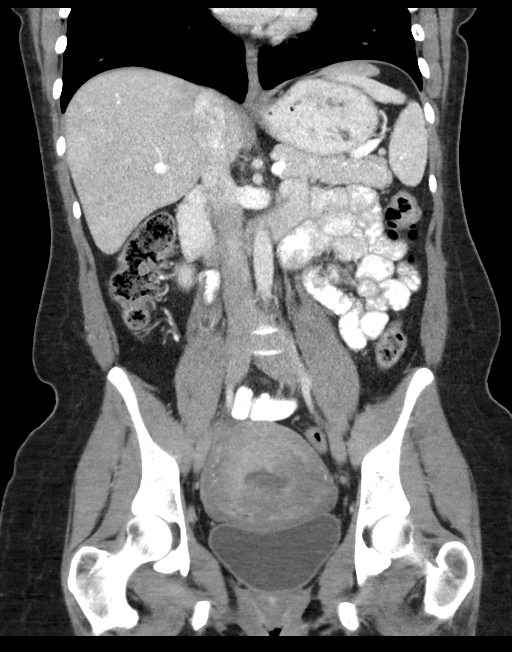

[15 of 46 positions shown; findings below may reference images not displayed]

FINDINGS: Lower chest: Unremarkable.

Hepatobiliary: No cystic or solid hepatic lesions. No intra or
extrahepatic biliary ductal dilatation. Gallbladder is normal in
appearance.

Pancreas: No pancreatic mass. No pancreatic ductal dilatation. No
pancreatic or peripancreatic fluid or inflammatory changes.

Spleen: Unremarkable.

Adrenals/Urinary Tract: Bilateral kidneys and bilateral adrenal
glands are normal in appearance. No hydroureteronephrosis. Urinary
bladder is normal in appearance.

Stomach/Bowel: Normal appearance of the stomach. The appendix is not
confidently identified and may be surgically absent. Regardless,
there are no inflammatory changes noted adjacent to the cecum to
suggest the presence of an acute appendicitis at this time.

Vascular/Lymphatic: No significant atherosclerotic disease, aneurysm
or dissection identified in the abdominal or pelvic vasculature.
Both gonadal veins appear patent. No lymphadenopathy noted in the
abdomen or pelvis.

Reproductive: There is an unusual enhancement pattern in the uterus.
Specifically, there is relative hypoenhancement throughout the left
side of the body and fundus of the uterus. The interface between the
normal endometrium and the myometrium is somewhat indistinct on the
left side, best appreciated on axial image 51 of series 7, coronal
image 37 of series 5 and sagittal image 59 of series 6. Ovaries are
unremarkable in appearance.

Other: Small umbilical hernia containing only omental fat. No
significant volume of ascites. No pneumoperitoneum.

Musculoskeletal: There are no aggressive appearing lytic or blastic
lesions noted in the visualized portions of the skeleton.
IMPRESSION: 1. No definite acute findings are noted in the abdomen or pelvis.
2. However, the appearance of the uterus is unusual, as discussed
above. This may simply be related to a post gravid uterus, with
slightly altered perfusion related to recent tubal ligation. The
indistinctness of the endometrial/myometrial interface noted in the
left side of the uterine fundus does raise concern for potential
retained products of conception. Alternatively, the possibility of
developing endometritis should be considered; notably, the gonadal
veins are patent bilaterally, without definite filling defects to
suggest thrombophlebitis at this time. Less likely, these findings
could be seen in the setting of adenomyosis in the left side of the
upper uterine body and fundus (although the associated perfusion
abnormality would be highly unusual with adenomyosis). Further
evaluation with nonemergent pelvic MRI with and without IV
gadolinium could be considered the near future to better evaluate
this finding.
3. Small umbilical hernia containing only omental fat.
These results were called by telephone at the time of interpretation
on 05/11/2016 at [DATE] to Dr. MORINI MASO, who verbally
acknowledged these results.

## 2019-01-27 ENCOUNTER — Other Ambulatory Visit: Payer: Self-pay

## 2019-01-27 ENCOUNTER — Encounter: Payer: Self-pay | Admitting: Advanced Practice Midwife

## 2019-01-27 ENCOUNTER — Ambulatory Visit: Payer: Self-pay | Admitting: Advanced Practice Midwife

## 2019-01-27 DIAGNOSIS — F319 Bipolar disorder, unspecified: Secondary | ICD-10-CM

## 2019-01-27 DIAGNOSIS — Z113 Encounter for screening for infections with a predominantly sexual mode of transmission: Secondary | ICD-10-CM

## 2019-01-27 DIAGNOSIS — B009 Herpesviral infection, unspecified: Secondary | ICD-10-CM | POA: Insufficient documentation

## 2019-01-27 LAB — WET PREP FOR TRICH, YEAST, CLUE
Trichomonas Exam: NEGATIVE
Yeast Exam: NEGATIVE

## 2019-01-27 MED ORDER — METRONIDAZOLE 500 MG PO TABS: 500.0000 mg | ORAL_TABLET | Freq: Two times a day (BID) | ORAL | 0 refills | Status: DC

## 2019-01-27 NOTE — Progress Notes (Signed)
Per client, s/p BTL. Rich Number, RN

## 2019-01-27 NOTE — Progress Notes (Signed)
Wet prep reviewed-+BV treated per Ola Spurr, CNM Debera Lat, RN

## 2019-01-27 NOTE — Progress Notes (Signed)
    STI clinic/screening visit  Subjective:  Julie Moss is a 28 y.o. female being seen today for an STI screening visit. The patient reports they do have symptoms.  Patient has the following medical conditions:   Patient Active Problem List   Diagnosis Date Noted  . Encounter for sterilization 05/03/2016  . Single umbilical artery 42/68/3419  . NST (non-stress test) nonreactive 04/25/2016  . Back pain affecting pregnancy 03/20/2016  . Abdominal pain affecting pregnancy 01/20/2016     Chief Complaint  Patient presents with  . SEXUALLY TRANSMITTED DISEASE    HPI  Patient reports she has had a fishy odor for the last month. She also has a yellow, watery discharge for the past 7 days. Pt also states she has had vaginal bleeding while having sex.  See flowsheet for further details and programmatic requirements.    The following portions of the patient's history were reviewed and updated as appropriate: allergies, current medications, past medical history, past social history, past surgical history and problem list.  Objective:  There were no vitals filed for this visit.  Physical Exam Constitutional:      General: She is not in acute distress.    Appearance: Normal appearance.  HENT:     Mouth/Throat:     Mouth: Mucous membranes are moist.     Pharynx: Oropharynx is clear. No oropharyngeal exudate or posterior oropharyngeal erythema.  Neck:     Musculoskeletal: Neck supple. No muscular tenderness.  Abdominal:     General: Abdomen is flat.     Palpations: Abdomen is soft.     Tenderness: There is abdominal tenderness.     Comments: Pt states 4/10 pain upon deep palpation. No guarding, no grimacing.  Genitourinary:    General: Normal vulva.     Labia:        Right: No rash or lesion.        Left: No rash or lesion.      Vagina: Vaginal discharge present.     Comments: Moderate amount of yellow, watery discharge. No foul odor detected. pH equivocal. No CMT upon  bimanual exam. Lymphadenopathy:     Cervical: No cervical adenopathy.  Skin:    General: Skin is warm and dry.     Findings: No erythema, lesion or rash.  Neurological:     Mental Status: She is alert.  Psychiatric:        Behavior: Behavior normal.        Judgment: Judgment normal.     Comments: Flat affect       Assessment and Plan:  FRANCELY CRAW is a 28 y.o. female presenting to the [facility] for STI screening  1. Screening examination for venereal disease Treat wet mount per standing orders Immunization nurse consult Referred to [facility] for PAP d/t BTL and Medicaid. - WET PREP FOR [place], [place], [place] [facility]   During interview pt stated she has been suffering from anxiety and depression. Referred to [facility] behavioral health services.   Return if symptoms worsen or fail to improve, for [facility].  No future appointments.  Herbie Saxon, CNM

## 2019-02-04 ENCOUNTER — Telehealth: Payer: Self-pay

## 2019-02-04 NOTE — Telephone Encounter (Signed)
TC to patient. Verified ID via password/SS#. Informed of positive chlamydia and need for tx. Instructed to eat before visit and have partner call for tx appt. Appt scheduled.

## 2019-02-05 ENCOUNTER — Other Ambulatory Visit: Payer: Self-pay

## 2019-02-05 ENCOUNTER — Ambulatory Visit: Payer: Self-pay

## 2019-02-05 DIAGNOSIS — A749 Chlamydial infection, unspecified: Secondary | ICD-10-CM

## 2019-02-05 MED ORDER — AZITHROMYCIN 500 MG PO TABS
1000.0000 mg | ORAL_TABLET | Freq: Once | ORAL | Status: AC
  Administered 2019-02-05: 17:00:00 1000 mg via ORAL

## 2019-02-05 NOTE — Progress Notes (Signed)
Patient tx'd for chlamydia per [facility] Kreg Earhart, RN  

## 2019-03-10 ENCOUNTER — Other Ambulatory Visit: Payer: Self-pay

## 2019-03-10 DIAGNOSIS — Z20822 Contact with and (suspected) exposure to covid-19: Secondary | ICD-10-CM

## 2019-03-11 ENCOUNTER — Telehealth: Payer: Self-pay

## 2019-03-11 LAB — NOVEL CORONAVIRUS, NAA: SARS-CoV-2, NAA: NOT DETECTED

## 2019-03-11 NOTE — Telephone Encounter (Signed)
Pt called to get COVID results, made her aware they are negative. °

## 2019-06-23 ENCOUNTER — Encounter: Payer: Self-pay | Admitting: Family Medicine

## 2019-06-23 ENCOUNTER — Other Ambulatory Visit: Payer: Self-pay

## 2019-06-23 ENCOUNTER — Ambulatory Visit: Payer: Self-pay | Admitting: Family Medicine

## 2019-06-23 DIAGNOSIS — B9689 Other specified bacterial agents as the cause of diseases classified elsewhere: Secondary | ICD-10-CM

## 2019-06-23 DIAGNOSIS — R87619 Unspecified abnormal cytological findings in specimens from cervix uteri: Secondary | ICD-10-CM | POA: Insufficient documentation

## 2019-06-23 DIAGNOSIS — N760 Acute vaginitis: Secondary | ICD-10-CM

## 2019-06-23 DIAGNOSIS — R87610 Atypical squamous cells of undetermined significance on cytologic smear of cervix (ASC-US): Secondary | ICD-10-CM

## 2019-06-23 DIAGNOSIS — Z113 Encounter for screening for infections with a predominantly sexual mode of transmission: Secondary | ICD-10-CM

## 2019-06-23 LAB — WET PREP FOR TRICH, YEAST, CLUE
Trichomonas Exam: NEGATIVE
Yeast Exam: NEGATIVE

## 2019-06-23 MED ORDER — METRONIDAZOLE 500 MG PO TABS: 500.0000 mg | ORAL_TABLET | Freq: Two times a day (BID) | ORAL | 0 refills | Status: AC

## 2019-06-23 NOTE — Progress Notes (Signed)
Patient here for STD screening. Has had BTL.Burt Knack, RN

## 2019-06-23 NOTE — Progress Notes (Signed)
[facility] Department STI clinic/screening visit  Subjective:  Julie Moss is a 29 y.o. female being seen today for an STI screening visit. The patient reports they do have symptoms. Patient's last menstrual period was 06/04/2019 (exact date).   Patient has the following medical conditions:   Patient Active Problem List   Diagnosis Date Noted  . 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 Bipolar 1 disorder ([place]) 01/27/2019  . Encounter for sterilization 05/03/2016    Chief Complaint  Patient presents with  . Exposure to STD    HPI  Patient reports odor- stated 3 weeks ago, vaginal bleeding/spotting started 2 days ago which was in between periods. She reports this only typically presents when she "has something". Known hx of HSV- uses valtrex prn. Reports increased urination  See flowsheet for further details and programmatic requirements.    The following portions of the patient's history were reviewed and updated as appropriate: allergies, current medications, past medical history, past social history, past surgical history and problem list.  Objective:  There were no vitals filed for this visit.  Physical Exam Vitals and nursing note reviewed.  Constitutional:      Appearance: Normal appearance.  HENT:     Head: Normocephalic and atraumatic.     Mouth/Throat:     Mouth: Mucous membranes are moist.     Pharynx: Oropharynx is clear. No oropharyngeal exudate or posterior oropharyngeal erythema.  Pulmonary:     Effort: Pulmonary effort is normal.  Abdominal:     General: Abdomen is flat.     Palpations: There is no mass.     Tenderness: There is no abdominal tenderness. There is no rebound.  Genitourinary:    General: Normal vulva.     Exam position: Lithotomy position.     Pubic Area: No rash or pubic lice.      Labia:        Right: No rash or lesion.        Left: No rash or lesion.      Vagina: Vaginal discharge (white/yellow somewhat frothy.. pH >4.5)  present. No erythema, bleeding or lesions.     Cervix: No cervical motion tenderness, discharge, friability, lesion or erythema.     Uterus: Normal.      Adnexa: Right adnexa normal and left adnexa normal.     Rectum: Normal.    Lymphadenopathy:     Head:     Right side of head: No preauricular or posterior auricular adenopathy.     Left side of head: No preauricular or posterior auricular adenopathy.     Cervical: No cervical adenopathy.     Upper Body:     Right upper body: No supraclavicular or axillary adenopathy.     Left upper body: No supraclavicular or axillary adenopathy.     Lower Body: No right inguinal adenopathy. No left inguinal adenopathy.  Skin:    General: Skin is warm and dry.     Findings: No rash.  Neurological:     Mental Status: She is alert and oriented to person, place, and time.      Assessment and Plan:  Julie Moss is a 29 y.o. female presenting to the [facility] Department for STI screening  1. Screening examination for venereal disease Patient accepted all screenings including oral, vaginal CT/GC and bloodwork for HIV/RPR.  Patient meets criteria for HepB screening? Yes. Ordered? Yes Patient meets criteria for HepC screening? Yes. Ordered? Yes  Treat wet prep per standing order  Discussed time line for State [facility] results and that patient will be called with positive results and encouraged patient to call if she had not heard in 2 weeks.  Counseled to return or seek care for continued or worsening symptoms Recommended condom use with all sex  Patient is currently using BTL to prevent pregnancy.    - HIV/HCV [place] [facility] - HBV Antigen/Antibody State Lab - Syphilis Serology, [place] [facility] - Chlamydia/Gonorrhea [place] [facility] - WET PREP FOR TRICH, YEAST, CLUE - Chlamydia/Gonorrhea [place] [facility]   2. History of abnormal paps-- BTL in 2017 with [facility] - strongly counseled on need for follow up pap smear given history of abnormal  values.  - Additionally chart has "LEEP" in surgical history but I am unable to find this record - given list of community providers and encouraged her to establish care at community health center - added [facility] pap history to pap tracking    Return if symptoms worsen or fail to improve.  No future appointments.  Federico Flake, MD

## 2019-06-23 NOTE — Progress Notes (Signed)
Wet mount reviewed, patient treated for BV per SO, PCP list given.Burt Knack, RN

## 2019-06-26 LAB — HEPATITIS B SURFACE ANTIGEN

## 2019-06-30 ENCOUNTER — Telehealth: Payer: Self-pay

## 2019-06-30 NOTE — Telephone Encounter (Signed)
TC to patient. Verified ID via password/SS#. Informed of positive chlamydia and need for tx. Instructed to eat before visit and have partner call for tx appt. Appt scheduled.Orlandria Kissner, RN    

## 2019-07-01 ENCOUNTER — Ambulatory Visit: Payer: Self-pay

## 2019-07-01 ENCOUNTER — Other Ambulatory Visit: Payer: Self-pay

## 2019-07-01 DIAGNOSIS — A5602 Chlamydial vulvovaginitis: Secondary | ICD-10-CM

## 2019-07-01 LAB — HM HEPATITIS C SCREENING LAB: HM Hepatitis Screen: NEGATIVE

## 2019-07-01 LAB — HM HIV SCREENING LAB: HM HIV Screening: NEGATIVE

## 2019-07-01 MED ORDER — AZITHROMYCIN 500 MG PO TABS
1000.0000 mg | ORAL_TABLET | Freq: Once | ORAL | Status: AC
  Administered 2019-07-01: 1000 mg via ORAL

## 2019-07-21 ENCOUNTER — Telehealth: Payer: Self-pay

## 2019-07-21 NOTE — Progress Notes (Signed)
Patient pre-screened for [facility]. Two patient identifiers used for verification that I was speaking to correct patient.  Patient to present directly to [facility].  Patient had a LEEP at [facility] in 2017.  Needs follow-up pap.  Reports she has left breast lump x 3 months.

## 2019-07-21 NOTE — Telephone Encounter (Signed)
B[facility]CP referral completed via Epic. Referred for hx of abn paps. Needs pap f/u Richmond Campbell, RN

## 2019-07-22 ENCOUNTER — Other Ambulatory Visit: Payer: Self-pay

## 2019-07-22 ENCOUNTER — Ambulatory Visit: Payer: Self-pay | Attending: Oncology

## 2019-07-22 VITALS — BP 112/79 | HR 64 | Temp 97.1°F | Ht 62.5 in | Wt 131.8 lb

## 2019-07-22 DIAGNOSIS — Z0000 Encounter for general adult medical examination without abnormal findings: Secondary | ICD-10-CM

## 2019-07-22 NOTE — Progress Notes (Signed)
  Subjective:     Patient ID: Julie Moss, female   DOB: [birth year], 29 y.o.   MRN: [id number]  HPI   Review of Systems     Objective:   Physical Exam Genitourinary:    Labia:        Right: No rash, tenderness, lesion or injury.        Left: No rash, tenderness, lesion or injury.      Cervix: Discharge present. No cervical motion tenderness, friability, lesion, erythema, cervical bleeding or eversion.     Uterus: Deviated. Not enlarged, not fixed, not tender and no uterine prolapse.      Adnexa:        Right: No mass, tenderness or fullness.         Left: No mass, tenderness or fullness.       Comments: Uterus retroflexed;  White non-odorous discharge observed       Assessment:     29 year old patient presents to [facility] with history of abnormal pap, and LEEP at [facility] in 2016.  She had a tubal ligation, and is unable to have pap at [facility] Department.  Pelvic exam normal.    Plan:     Specimen collected for pap. Called patient to return for breast exam.

## 2019-07-28 LAB — IGP, APTIMA HPV: HPV Aptima: NEGATIVE

## 2019-07-30 NOTE — Progress Notes (Signed)
Phoned patient with ASCUS/HPV negative pap results. Informed her we would repeat pap in one year due to prior abnormal pap/colposcopy results.  Encouraged patient to return to clinic for breast exam.  She is to call back in April to schedule appointment for breast exam.

## 2019-11-11 ENCOUNTER — Other Ambulatory Visit: Payer: Self-pay

## 2019-11-11 ENCOUNTER — Ambulatory Visit: Payer: Self-pay | Admitting: Physician Assistant

## 2019-11-11 ENCOUNTER — Encounter: Payer: Self-pay | Admitting: Physician Assistant

## 2019-11-11 DIAGNOSIS — Z299 Encounter for prophylactic measures, unspecified: Secondary | ICD-10-CM

## 2019-11-11 DIAGNOSIS — N760 Acute vaginitis: Secondary | ICD-10-CM

## 2019-11-11 DIAGNOSIS — B9689 Other specified bacterial agents as the cause of diseases classified elsewhere: Secondary | ICD-10-CM

## 2019-11-11 DIAGNOSIS — Z113 Encounter for screening for infections with a predominantly sexual mode of transmission: Secondary | ICD-10-CM

## 2019-11-11 LAB — WET PREP FOR TRICH, YEAST, CLUE
Trichomonas Exam: NEGATIVE
Yeast Exam: NEGATIVE

## 2019-11-11 MED ORDER — METRONIDAZOLE 500 MG PO TABS: 500.0000 mg | ORAL_TABLET | Freq: Two times a day (BID) | ORAL | 0 refills | Status: AC

## 2019-11-11 MED ORDER — ACYCLOVIR 800 MG PO TABS: 800.0000 mg | ORAL_TABLET | Freq: Every day | ORAL | 12 refills | Status: DC

## 2019-11-11 NOTE — Progress Notes (Signed)
Patient here for STD testing.Lailyn Appelbaum Brewer-Jensen, RN 

## 2019-11-11 NOTE — Progress Notes (Signed)
Wet mount reviewed by provider, pt treated for BV per provider orders. Pt received handwritten Rx for Acyclovir 800 mg 1 tab po qd, refill 1 year. Provider orders completed.

## 2019-11-12 ENCOUNTER — Encounter: Payer: Self-pay | Admitting: Physician Assistant

## 2019-11-12 NOTE — Progress Notes (Signed)
[facility] Department STI clinic/screening visit  Subjective:  Julie Moss is a 29 y.o. female being seen today for an STI screening visit. The patient reports they do have symptoms.  Patient reports that they do not desire a pregnancy in the next year.   They reported they are not interested in discussing contraception today.  Patient's last menstrual period was 10/18/2019.   Patient has the following medical conditions:   Patient Active Problem List   Diagnosis Date Noted  . Abnormal Pap smear of cervix 06/23/2019  . 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 Bipolar 1 disorder (HCC) 01/27/2019  . Encounter for sterilization 05/03/2016    Chief Complaint  Patient presents with  . SEXUALLY TRANSMITTED DISEASE    screening    HPI  Patient reports that she has had a "foul odor" for 2 weeks.  Denies other symptoms.  Reports that she has had a BTL, had last pap 09/2019 and last HIV testing 3 months ago.  Requests Rx for Acyclovir to change to suppressive treatment from episodic.   See flowsheet for further details and programmatic requirements.    The following portions of the patient's history were reviewed and updated as appropriate: allergies, current medications, past medical history, past social history, past surgical history and problem list.  Objective:  There were no vitals filed for this visit.  Physical Exam Constitutional:      General: She is not in acute distress.    Appearance: Normal appearance.  HENT:     Head: Normocephalic and atraumatic.     Comments: No nits, lice, or hair loss. No cervical, supraclavicular or axillary adenopathy.    Mouth/Throat:     Mouth: Mucous membranes are moist.     Pharynx: Oropharynx is clear. No oropharyngeal exudate or posterior oropharyngeal erythema.  Eyes:     Conjunctiva/sclera: Conjunctivae normal.  Pulmonary:     Effort: Pulmonary effort is normal.  Abdominal:     Palpations: Abdomen is soft. There is no  mass.     Tenderness: There is no abdominal tenderness. There is no guarding or rebound.  Genitourinary:    General: Normal vulva.     Rectum: Normal.     Comments: External genitalia/pubic area without nits, lice, edema, erythema, lesions and inguinal adenopathy. Vagina with normal mucosa and moderate amount of thin, grayish discharge. Cervix without visible lesions. Uterus firm, mobile, nt, no masses, no CMT, no adnexal tenderness or fullness. Musculoskeletal:     Cervical back: Neck supple. No tenderness.  Skin:    General: Skin is warm and dry.     Findings: No bruising, erythema, lesion or rash.  Neurological:     Mental Status: She is alert and oriented to person, place, and time.  Psychiatric:        Mood and Affect: Mood normal.        Behavior: Behavior normal.        Thought Content: Thought content normal.        Judgment: Judgment normal.      Assessment and Plan:  Julie Moss is a 29 y.o. female presenting to the [facility] Department for STI screening  1. Screening for STD (sexually transmitted disease) Patient into clinic without symptoms. Declines blood work today. Rec condoms with all sex. Await test results.  Counseled that RN will call if needs to RTC for treatment once results are back. - WET PREP FOR TRICH, YEAST, CLUE - Gonococcus culture - Chlamydia/Gonorrhea [place] [facility]  2. BV (bacterial vaginosis) Treat BV with Metronidazole 500mg  #14 1 po BID for 7 days with food, no EtOH for 24 hr before and until 72 hr after taking medicine. No sex for 7 days. Enc to use OTC antifungal cream if has itching during or just after antibiotic use. - metroNIDAZOLE (FLAGYL) 500 MG tablet; Take 1 tablet (500 mg total) by mouth 2 (two) times daily for 7 days.  Dispense: 14 tablet; Refill: 0  3. Prophylactic measure Rx for Acyclovir 800mg   #30 1 po daily with refills for 1 year written and to be given to patient to change from episodic to suppressive  treatment per patient request. Patient to check with pharmacy app to see where she can get Rx filled at least cost. - acyclovir (ZOVIRAX) 800 MG tablet; Take 1 tablet (800 mg total) by mouth daily.  Dispense: 30 tablet; Refill: 12     No follow-ups on file.  Future Appointments  Date Time Provider Department Center  07/27/2020  2:00 PM [facility] CLINIC [facility] None    , 07/29/2020

## 2019-11-16 LAB — GONOCOCCUS CULTURE

## 2020-07-08 ENCOUNTER — Encounter: Payer: Self-pay | Admitting: Physician Assistant

## 2020-07-08 ENCOUNTER — Ambulatory Visit: Payer: Medicaid Other

## 2020-07-08 ENCOUNTER — Ambulatory Visit: Payer: Self-pay | Admitting: Physician Assistant

## 2020-07-08 ENCOUNTER — Other Ambulatory Visit: Payer: Self-pay

## 2020-07-08 DIAGNOSIS — N760 Acute vaginitis: Secondary | ICD-10-CM

## 2020-07-08 DIAGNOSIS — Z113 Encounter for screening for infections with a predominantly sexual mode of transmission: Secondary | ICD-10-CM

## 2020-07-08 DIAGNOSIS — B9689 Other specified bacterial agents as the cause of diseases classified elsewhere: Secondary | ICD-10-CM

## 2020-07-08 LAB — WET PREP FOR TRICH, YEAST, CLUE
Trichomonas Exam: NEGATIVE
Yeast Exam: NEGATIVE

## 2020-07-08 MED ORDER — METRONIDAZOLE 500 MG PO TABS: 500.0000 mg | ORAL_TABLET | Freq: Two times a day (BID) | ORAL | 0 refills | Status: AC

## 2020-07-08 NOTE — Progress Notes (Signed)
Chart reviewed by Pharmacist  Suzanne Walker PharmD, Contract Pharmacist at [facility]  

## 2020-07-08 NOTE — Progress Notes (Signed)
[facility] Department STI clinic/screening visit  Subjective:  Julie Moss is a 30 y.o. female being seen today for an STI screening visit. The patient reports they do have symptoms.  Patient reports that they do not desire a pregnancy in the next year.   They reported they are not interested in discussing contraception today.  No LMP recorded.   Patient has the following medical conditions:   Patient Active Problem List   Diagnosis Date Noted  . Abnormal Pap smear of cervix 06/23/2019  . 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 Bipolar 1 disorder (HCC) 01/27/2019  . Encounter for sterilization 05/03/2016    Chief Complaint  Patient presents with  . SEXUALLY TRANSMITTED DISEASE    screening    HPI  Patient reports that she has had dysuria and vaginal odor for 1 month.  Denies chronic conditions.  States that she has had BTL for [facility], LMP was 06/17/2020 and last HIV test was in 2021 and last pap was also in 2021.   See flowsheet for further details and programmatic requirements.    The following portions of the patient's history were reviewed and updated as appropriate: allergies, current medications, past medical history, past social history, past surgical history and problem list.  Objective:  There were no vitals filed for this visit.  Physical Exam Constitutional:      General: She is not in acute distress.    Appearance: Normal appearance.  HENT:     Head: Normocephalic and atraumatic.     Comments: No nits,lice, or hair loss. No cervical, supraclavicular or axillary adenopathy.    Mouth/Throat:     Mouth: Mucous membranes are moist.     Pharynx: Oropharynx is clear. No oropharyngeal exudate or posterior oropharyngeal erythema.  Eyes:     Conjunctiva/sclera: Conjunctivae normal.  Pulmonary:     Effort: Pulmonary effort is normal.  Abdominal:     Palpations: Abdomen is soft. There is no mass.     Tenderness: There is no abdominal tenderness. There is no  guarding or rebound.  Genitourinary:    General: Normal vulva.     Rectum: Normal.     Comments: External genitalia/pubic area without nits, lice, edema, erythema, lesions and inguinal adenopathy. Vagina with normal mucosa and moderate amount of thick, white discharge, pH=>4.5. Cervix without visible lesions. Uterus firm, mobile, nt, no masses, no CMT, no adnexal tenderness or fullness. Musculoskeletal:     Cervical back: Neck supple. No tenderness.  Skin:    General: Skin is warm and dry.     Findings: No bruising, erythema, lesion or rash.  Neurological:     Mental Status: She is alert and oriented to person, place, and time.  Psychiatric:        Mood and Affect: Mood normal.        Behavior: Behavior normal.        Thought Content: Thought content normal.        Judgment: Judgment normal.      Assessment and Plan:  Julie Moss is a 30 y.o. female presenting to the [facility] Department for STI screening  1. Screening for STD (sexually transmitted disease) Patient into clinic with symptoms. Rec condoms with all sex. Await test results.  Counseled that RN will call if needs to RTC for treatment once results are back. - WET PREP FOR TRICH, YEAST, CLUE - Gonococcus culture - Chlamydia/Gonorrhea [place] [facility]  2. BV (bacterial vaginosis) Will treat for BV with Metronidazole  500 mg #14 1 po BID for 7 days with food, no EtOH for 24 hr before and until 72 hr after completing medicine. No sex for 14 days. Enc to use OTC antifungal cream if has itching during or just after antibiotic use. - metroNIDAZOLE (FLAGYL) 500 MG tablet; Take 1 tablet (500 mg total) by mouth 2 (two) times daily for 7 days.  Dispense: 14 tablet; Refill: 0     No follow-ups on file.  Future Appointments  Date Time Provider Department Center  07/27/2020  2:00 PM [facility] CLINIC [facility] None    Matt Holmes, [place]

## 2020-07-12 LAB — GONOCOCCUS CULTURE

## 2020-07-20 ENCOUNTER — Telehealth: Payer: Self-pay

## 2020-07-20 NOTE — Telephone Encounter (Signed)
Call returned from patient. Patient informed for positive chlamydia result. Scheduled for tx appt tomorrow. Patient instructed to eat before arrival and no sex. All questions answered.   Harvie Heck, RN

## 2020-07-20 NOTE — Telephone Encounter (Signed)
RN called patient to inform her of positive chlamydia from 07/08/2020. Patient needs treatment. LMOM to return call [phone].  Harvie Heck, RN

## 2020-07-21 ENCOUNTER — Ambulatory Visit: Payer: Self-pay

## 2020-07-21 ENCOUNTER — Other Ambulatory Visit: Payer: Self-pay

## 2020-07-21 DIAGNOSIS — A749 Chlamydial infection, unspecified: Secondary | ICD-10-CM

## 2020-07-21 MED ORDER — DOXYCYCLINE HYCLATE 100 MG PO TABS: 100.0000 mg | ORAL_TABLET | Freq: Two times a day (BID) | ORAL | 0 refills | Status: AC

## 2020-07-21 NOTE — Progress Notes (Signed)
In Nurse Clinic for chlamydia tx. Hx BTL 2017. Doxycycline 100 mg #14 dispensed per standing order by  K. Alvester Morin, MD with instructions to take bid by mouth x 7 days. Advised to eat before taking med and to call [facility] for re treatment if vomits within 2 hrs of taking med. Pt asks about tanning schedule adjustments while on med. Consult with Beatris Si, PA who advises pt to abstain from tanning/tanning bed until 5-7 days after completion of doxycycline d/t hypersensitivity to UV rays while on doxy. When pt resumes tanning, provider advises pt to shorten tanning session to one half duration in order to assess skin sensitivity. If pt burns more easily when resumes tanning, provider advises pt to wait another 5 days before tanning again. RN counseled pt on provider recommendations. Pt in agreement. Questions answered and reports understanding. Jerel Shepherd, RN

## 2020-07-22 NOTE — Progress Notes (Signed)
Consulted by RN re: patient situation.  Reviewed RN note and agree that it reflects our discussion and my recommendations. 

## 2020-07-27 ENCOUNTER — Ambulatory Visit: Payer: Medicaid Other

## 2020-08-16 ENCOUNTER — Ambulatory Visit: Payer: Medicaid Other

## 2020-09-19 ENCOUNTER — Other Ambulatory Visit: Payer: Self-pay

## 2020-09-19 ENCOUNTER — Ambulatory Visit: Payer: Self-pay | Admitting: Physician Assistant

## 2020-09-19 DIAGNOSIS — Z113 Encounter for screening for infections with a predominantly sexual mode of transmission: Secondary | ICD-10-CM

## 2020-09-19 DIAGNOSIS — Z299 Encounter for prophylactic measures, unspecified: Secondary | ICD-10-CM

## 2020-09-19 LAB — WET PREP FOR TRICH, YEAST, CLUE
Trichomonas Exam: NEGATIVE
Yeast Exam: NEGATIVE

## 2020-09-19 MED ORDER — AZITHROMYCIN 500 MG PO TABS
1000.0000 mg | ORAL_TABLET | Freq: Once | ORAL | Status: AC
  Administered 2020-09-21: 1000 mg via ORAL

## 2020-09-21 ENCOUNTER — Encounter: Payer: Self-pay | Admitting: Physician Assistant

## 2020-09-21 NOTE — Progress Notes (Addendum)
[facility] Department STI clinic/screening visit  Subjective:  Julie Moss is a 30 y.o. female being seen today for an STI screening visit. The patient reports they do have symptoms.  Patient reports that they do not desire a pregnancy in the next year.   They reported they are not interested in discussing contraception today.  No LMP recorded.   Patient has the following medical conditions:   Patient Active Problem List   Diagnosis Date Noted  . Abnormal Pap smear of cervix 06/23/2019  . 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 Bipolar 1 disorder (HCC) 01/27/2019  . Encounter for sterilization 05/03/2016    Chief Complaint  Patient presents with  . SEXUALLY TRANSMITTED DISEASE    screening    HPI  Patient reports that she has had a slight vaginal odor for 1 week and that she did not complete the Doxycycline that she was given at her last visit, so requests the "one time pills" to be retreated for Chlamydia.  Reports last HIV test was in 2021 and last pap was in 2021.  Has had BTL as BCM and LMP 09/05/2020.  Patient also concerned about weight loss and "sour stomach" feeling for last 2 months.   See flowsheet for further details and programmatic requirements.    The following portions of the patient's history were reviewed and updated as appropriate: allergies, current medications, past medical history, past social history, past surgical history and problem list.  Objective:  There were no vitals filed for this visit.  Physical Exam Constitutional:      General: She is not in acute distress.    Appearance: Normal appearance.  HENT:     Head: Normocephalic and atraumatic.     Comments: No nits,lice, or hair loss. No cervical, supraclavicular or axillary adenopathy.    Mouth/Throat:     Mouth: Mucous membranes are moist.     Pharynx: Oropharynx is clear. No oropharyngeal exudate or posterior oropharyngeal erythema.  Eyes:     Conjunctiva/sclera: Conjunctivae  normal.  Pulmonary:     Effort: Pulmonary effort is normal.  Abdominal:     Palpations: Abdomen is soft. There is no mass.     Tenderness: There is no abdominal tenderness. There is no guarding or rebound.  Genitourinary:    General: Normal vulva.     Rectum: Normal.     Comments: External genitalia/pubic area without nits, lice, edema, erythema, lesions and inguinal adenopathy. Vagina with normal mucosa and discharge. Cervix without visible lesions. Uterus firm, mobile, nt, no masses, no CMT, no adnexal tenderness or fullness. Musculoskeletal:     Cervical back: Neck supple. No tenderness.  Skin:    General: Skin is warm and dry.     Findings: No bruising, erythema, lesion or rash.  Neurological:     Mental Status: She is alert and oriented to person, place, and time.  Psychiatric:        Mood and Affect: Mood normal.        Behavior: Behavior normal.        Thought Content: Thought content normal.        Judgment: Judgment normal.      Assessment and Plan:  Julie Moss is a 30 y.o. female presenting to the [facility] Department for STI screening  1. Screening for STD (sexually transmitted disease) Patient into clinic with symptoms. Reviewed with patient that wet mount is normal today. Enc patient to follow up with PCP re: concerns about weight loss  and "sour stomach".  PCP list offered to patient. Rec condoms with all sex. Await test results.  Counseled that RN will call if needs to RTC for treatment once results are back. - WET PREP FOR TRICH, YEAST, CLUE - Chlamydia/Gonorrhea [place] [facility] - HIV [place] [facility] - Syphilis Serology,  Lab  2. Prophylactic measure Will treat/retreat for Chlamydia with Azithromycin 1 g po DOT today. No sex for 14 days and until after partner completes treatment. RTC if vomits < 2 hr after taking medicine for retreatment. - azithromycin (ZITHROMAX) tablet 1,000 mg     No follow-ups on file.  Future  Appointments  Date Time Provider Department Center  11/16/2020 11:00 AM [facility] CLINIC [facility] None    Matt Holmes, [place]

## 2020-09-23 LAB — HM HIV SCREENING LAB: HM HIV Screening: NEGATIVE

## 2020-11-16 ENCOUNTER — Other Ambulatory Visit: Payer: Self-pay

## 2020-11-16 ENCOUNTER — Encounter: Payer: Self-pay | Admitting: *Deleted

## 2020-11-16 ENCOUNTER — Ambulatory Visit: Payer: Medicaid Other | Attending: Oncology | Admitting: *Deleted

## 2020-11-16 VITALS — BP 117/71 | HR 69 | Temp 97.4°F | Ht 62.0 in | Wt 121.0 lb

## 2020-11-16 DIAGNOSIS — Z0000 Encounter for general adult medical examination without abnormal findings: Secondary | ICD-10-CM

## 2020-11-16 NOTE — Patient Instructions (Signed)
Gave patient hand-out, Women Staying Healthy, Active and Well from [facility], with education on breast health, pap smears, heart and colon health. 

## 2020-11-16 NOTE — Progress Notes (Signed)
  Subjective:     Patient ID: Julie Moss, female   DOB: 10/08/1990, 30 y.o.   MRN: [id number]  HPI  [facility] Medical History Record - 11/16/20 1116       Breast History   Screening cycle New    Provider (CBE) [facility]    Last Mammogram Never    Recent Breast Symptoms --   left breast nodule     Breast Cancer History   Comments/Details maternalAunt breast; father liver; p-aunt liver      Previous History of Breast Problems   Breast Surgery or Biopsy None    Breast Implants N/A    BSE Done Monthly      Gynecological/Obstetrical History   LMP 10/31/20    Is there any chance that the client could be pregnant?  No    Age at menarche 5    Age at menopause n/a    Date of last PAP  07/22/19    Provider (PAP) [facility]    Age at first live birth 50    Breast fed children No    DES Exposure No    Cervical, Uterine or Ovarian cancer No    Family history of Cervial, Uterine or Ovarian cancer No    Hysterectomy No    Cervix removed No    Ovaries removed No    Laser/Cryosurgery No    Current method of birth control --   tubal ligation   Current method of Estrogen/Hormone replacement None    Smoking history None    Comments No insurance               Review of Systems     Objective:   Physical Exam Chest:  Breasts:    Right: No swelling, bleeding, inverted nipple, mass, nipple discharge, skin change, tenderness, axillary adenopathy or supraclavicular adenopathy.     Left: No swelling, bleeding, inverted nipple, mass, nipple discharge, skin change, tenderness, axillary adenopathy or supraclavicular adenopathy.    Lymphadenopathy:     Upper Body:     Right upper body: No supraclavicular or axillary adenopathy.     Left upper body: No supraclavicular or axillary adenopathy.       Assessment:     30 year old female returns to [facility] today for pap smear.  Last pap was HPV negative ASCUS.  Per Anne's notes patient had a LEEP in 2017, and per [facility]P guidelines 1 year follow  pap is recommended.  I am unable to find the patient's LEEP procedure today for review.  Will go ahead and collect specimen for pap smear. Green odorous discharge noted on exam.  Patient states she has had a left breast mass for about 3 year.  On clinical breast exam bilateral breast have fibroglandular tissue, with greater tissue on the left.  Patient had difficulty finding the area of concern.  Had patient palpate all the other areas of glandular tissue in the left breast.  She is agreeable her area of concern feels the same.  Taught self breast awareness.  Patient has been screened for eligibility.  She does not have any insurance, Medicare or Medicaid.  She also meets financial eligibility.      Plan:     Specimen for pap sent to the lab.  Offered for patient to return in 3 months for repeat clinical breast exam, but patient refused.  States she feels it all feels the same.  Will follow up per [facility]CP protocol.

## 2020-11-20 LAB — IGP, APTIMA HPV: HPV Aptima: POSITIVE — AB

## 2020-11-22 NOTE — Progress Notes (Signed)
LSIL/HPV positive Pap results reported to patient.  H[place]ley Dec at [facility] to phone patient to schedule colposcopy.

## 2020-11-25 ENCOUNTER — Other Ambulatory Visit: Payer: Self-pay | Admitting: Physician Assistant

## 2020-11-25 ENCOUNTER — Telehealth: Payer: Self-pay | Admitting: Family Medicine

## 2020-11-25 DIAGNOSIS — B009 Herpesviral infection, unspecified: Secondary | ICD-10-CM

## 2020-11-25 MED ORDER — ACYCLOVIR 800 MG PO TABS: 800.0000 mg | ORAL_TABLET | Freq: Every day | ORAL | 11 refills | Status: AC

## 2020-11-25 NOTE — Telephone Encounter (Signed)
I need a refill on my meds

## 2020-11-25 NOTE — Progress Notes (Signed)
Per patient phone call, requesting refills on Acyclovir.  Per chart review, last Rx was on 11/11/2019.  Refill for Acyclovir 800 mg #30 1 po daily with refills for 1 year sent to patient's pharmacy of choice.

## 2020-12-13 ENCOUNTER — Encounter: Payer: Self-pay | Admitting: Obstetrics and Gynecology

## 2020-12-13 ENCOUNTER — Other Ambulatory Visit (HOSPITAL_COMMUNITY)
Admission: RE | Admit: 2020-12-13 | Discharge: 2020-12-13 | Disposition: A | Discharge status: Home / Self Care | Payer: Medicaid Other | Source: Ambulatory Visit | Attending: Obstetrics and Gynecology | Admitting: Obstetrics and Gynecology

## 2020-12-13 ENCOUNTER — Other Ambulatory Visit: Payer: Self-pay

## 2020-12-13 ENCOUNTER — Ambulatory Visit (INDEPENDENT_AMBULATORY_CARE_PROVIDER_SITE_OTHER): Payer: Self-pay | Admitting: Obstetrics and Gynecology

## 2020-12-13 VITALS — BP 120/80 | Ht 62.0 in | Wt 122.0 lb

## 2020-12-13 DIAGNOSIS — Z0189 Encounter for other specified special examinations: Secondary | ICD-10-CM | POA: Insufficient documentation

## 2020-12-13 DIAGNOSIS — R87810 Cervical high risk human papillomavirus (HPV) DNA test positive: Secondary | ICD-10-CM | POA: Insufficient documentation

## 2020-12-13 DIAGNOSIS — R87612 Low grade squamous intraepithelial lesion on cytologic smear of cervix (LGSIL): Secondary | ICD-10-CM | POA: Diagnosis not present

## 2020-12-13 DIAGNOSIS — N871 Moderate cervical dysplasia: Secondary | ICD-10-CM

## 2020-12-13 NOTE — Progress Notes (Signed)
Referring Provider:  B[facility]CP program  HPI:  Julie Moss is a 30 y.o.  [phone]  who presents today for evaluation and management of abnormal cervical cytology.    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    OB History  Gravida Para Term Preterm AB Living  4 3 3   1 2   SAB IAB Ectopic Multiple Live Births  1     0      # Outcome Date GA Lbr Len/2nd Weight Sex Delivery Anes PTL Lv  4 Term 05/01/16 [redacted]w[redacted]d / 00:32 5 lb 13.5 oz (2.65 kg) F Vag-Spont EPI    3 Term 12/28/10   6 lb 14 oz (3.118 kg) M Vag-Spont     2 Term 01/15/05   6 lb 11 oz (3.033 kg) F Vag-Spont     1 SAB             Past Medical History:  Diagnosis Date   Mood disorder (HCC)    Ovarian cyst     Past Surgical History:  Procedure Laterality Date   BREAST BIOPSY     LEEP  Jan 2017   TONSILLECTOMY     TUBAL LIGATION N/A 05/03/2016   Procedure: POST PARTUM TUBAL LIGATION;  Surgeon: 05/05/2016, MD;  Location: [facility] ORS;  Service: Gynecology;  Laterality: N/A;    SOCIAL HISTORY:  Social History   Substance and Sexual Activity  Alcohol Use Yes   Comment: rarely    Social History   Substance and Sexual Activity  Drug Use Not Currently   Types: Marijuana     History reviewed. No pertinent family history.  ALLERGIES:  Sulfa antibiotics  Current Outpatient Medications on File Prior to Visit  Medication Sig Dispense Refill   acyclovir (ZOVIRAX) 800 MG tablet Take 1 tablet (800 mg total) by mouth daily. 30 tablet 12   acyclovir (ZOVIRAX) 800 MG tablet Take 1 tablet (800 mg total) by mouth daily. 30 tablet 11   valACYclovir (VALTREX) 1000 MG tablet Take 1,000 mg by mouth 2 (two) times daily.     No current facility-administered medications on file prior to visit.    Physical Exam: -Vitals:  BP 120/80   Ht 5\' 2"  (1.575 m)   Wt 122 lb (55.3 kg)   LMP 11/30/2020   BMI 22.31 kg/m  GEN: WD, WN, NAD.  A+ O x 3, good  mood and affect. ABD:  NT, ND.  Soft, no masses.  No hernias noted.   Pelvic:   Vulva: Normal appearance.  No lesions.  Vagina: No lesions or abnormalities noted.  Support: Normal pelvic support.  Urethra No masses tenderness or scarring.  Meatus Normal size without lesions or prolapse.  Cervix: See below.  Anus: Normal exam.  No lesions.  Perineum: Normal exam.  No lesions.        Bimanual   Uterus: Normal size.  Non-tender.  Mobile.  AV.  Adnexae: No masses.  Non-tender to palpation.  Cul-de-sac: Negative for abnormality.   PROCEDURE: 1.  Urine Pregnancy Test:  not done due to s/p BTL 2.  Colposcopy performed with 4% acetic acid after verbal consent obtained                                         -Aceto-white Lesions Location(s): mild  diffusely, slightly more pronounced at 1 o'clock              -Biopsy performed at 4, 7, and 2 o'clock               -ECC indicated and performed: Yes.       -Biopsy sites made hemostatic with pressure, AgNO3, and/or Monsel's solution   -Satisfactory colposcopy: No.    -Evidence of Invasive cervical CA :  NO  ASSESSMENT:  Julie Moss is a 30 y.o. [id number] here for  1. LGSIL on Pap smear of cervix   2. Pap smear of cervix shows high risk HPV present   .  PLAN: I discussed the grading system of pap smears and HPV high risk viral types.  We will discuss and base management after colpo results return.     Thomasene Mohair, MD  [facility], [facility] 12/13/2020  3:02 PM   CC: Attn: Coralee Rud, RN [facility]CP program

## 2020-12-15 LAB — SURGICAL PATHOLOGY

## 2021-08-10 ENCOUNTER — Ambulatory Visit: Payer: Self-pay | Admitting: Family Medicine

## 2021-08-10 ENCOUNTER — Encounter: Payer: Self-pay | Admitting: Family Medicine

## 2021-08-10 DIAGNOSIS — B9689 Other specified bacterial agents as the cause of diseases classified elsewhere: Secondary | ICD-10-CM

## 2021-08-10 DIAGNOSIS — N760 Acute vaginitis: Secondary | ICD-10-CM

## 2021-08-10 DIAGNOSIS — Z113 Encounter for screening for infections with a predominantly sexual mode of transmission: Secondary | ICD-10-CM

## 2021-08-10 LAB — WET PREP FOR TRICH, YEAST, CLUE
Trichomonas Exam: NEGATIVE
Yeast Exam: NEGATIVE

## 2021-08-10 MED ORDER — METRONIDAZOLE 500 MG PO TABS: 500.0000 mg | ORAL_TABLET | Freq: Two times a day (BID) | ORAL | 0 refills | Status: AC

## 2021-08-10 NOTE — Progress Notes (Signed)
Pt here for STD screening.  Wet mount results reviewed and medication dispensed per Provider orders.  Condoms declined.  Julie Orrego M Jaycen Vercher, RN ? ?

## 2021-08-13 NOTE — Progress Notes (Signed)
[facility] Department ? ?STI clinic/screening visit ?[place] [address] ?[phone] ? ?Subjective:  ?Julie Moss is a 31 y.o. female being seen today for an STI screening visit. The patient reports they do have symptoms.  Patient reports that they do not desire a pregnancy in the next year.   They reported they are not interested in discussing contraception today.   ? ?Patient's last menstrual period was 07/19/2021 (exact date). ? ? ?Patient has the following medical conditions:   ?Patient Active Problem List  ? Diagnosis Date Noted  ? Abnormal Pap smear of cervix 06/23/2019  ? 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 Bipolar 1 disorder ([place]) 01/27/2019  ? Encounter for sterilization 05/03/2016  ? ? ?Chief Complaint  ?Patient presents with  ? SEXUALLY TRANSMITTED DISEASE  ?  Screening  ? ? ?HPI ? ?Patient reports here for screening, I need medicine for BV, I have BV more often since my "[facility] program"  ? ?Last HIV test per patient/review of record was 09/2020 ?Patient reports last pap was 11/2020.  ? ?Screening for MPX risk: ?Does the patient have an unexplained rash? No ?Is the patient MSM? No ?Does the patient endorse multiple sex partners or anonymous sex partners? No ?Did the patient have close or sexual contact with a person diagnosed with MPX? No ?Has the patient traveled outside the [place] where MPX is endemic? No ?Is there a high clinical suspicion for MPX-- evidenced by one of the following No ? -Unlikely to be chickenpox ? -Lymphadenopathy ? -Rash that present in same phase of evolution on any given body part ?See flowsheet for further details and programmatic requirements.  ? ? ?The following portions of the patient's history were reviewed and updated as appropriate: allergies, current medications, past medical history, past social history, past surgical history and problem list. ? ?Objective:  ?There were no vitals filed for this visit. ? ?Physical Exam ?Vitals and nursing  note reviewed.  ?Constitutional:   ?   Appearance: Normal appearance.  ?HENT:  ?   Head: Normocephalic and atraumatic.  ?   Mouth/Throat:  ?   Mouth: Mucous membranes are moist.  ?   Pharynx: Oropharynx is clear. No oropharyngeal exudate or posterior oropharyngeal erythema.  ?Pulmonary:  ?   Effort: Pulmonary effort is normal.  ?Abdominal:  ?   General: Abdomen is flat.  ?   Palpations: There is no mass.  ?   Tenderness: There is no abdominal tenderness. There is no rebound.  ?Genitourinary: ?   General: Normal vulva.  ?   Exam position: Lithotomy position.  ?   Pubic Area: No rash or pubic lice.   ?   Labia:     ?   Right: No rash or lesion.     ?   Left: No rash or lesion.   ?   Vagina: Normal. No vaginal discharge, erythema, bleeding or lesions.  ?   Cervix: No cervical motion tenderness, discharge, friability, lesion or erythema.  ?   Uterus: Normal.   ?   Adnexa: Right adnexa normal and left adnexa normal.  ?   Rectum: Normal.  ?   Comments: External genitalia without, lice, nits, erythema, edema , lesions or inguinal adenopathy. Vagina with normal mucosa and  white discharge and pH >4.  Cervix without visual lesions, uterus firm, mobile, non-tender, no masses, CMT adnexal fullness or tenderness.  ? ?Lymphadenopathy:  ?   Head:  ?   Right side of head:  No preauricular or posterior auricular adenopathy.  ?   Left side of head: No preauricular or posterior auricular adenopathy.  ?   Cervical: No cervical adenopathy.  ?   Upper Body:  ?   Right upper body: No supraclavicular or axillary adenopathy.  ?   Left upper body: No supraclavicular or axillary adenopathy.  ?   Lower Body: No right inguinal adenopathy. No left inguinal adenopathy.  ?Skin: ?   General: Skin is warm and dry.  ?   Findings: No rash.  ?Neurological:  ?   General: No focal deficit present.  ?   Mental Status: She is alert and oriented to person, place, and time.  ?Psychiatric:     ?   Mood and Affect: Mood normal.     ?   Behavior: Behavior  normal.  ? ? ? ?Assessment and Plan:  ?Julie Moss is a 31 y.o. female presenting to the [facility] Department for STI screening ? ?1. Screening examination for venereal disease ?Patient accepted all screenings including wet prep, oral, vaginal CT/GC and declined bloodwork for HIV/RPR.  ?Patient meets criteria for HepB screening? No. Ordered? No - no longer using  ?Patient meets criteria for HepC screening? No. Ordered? No - no longer using  ? ?Wet prep results + amine, + clue    ?Treatment needed  ?Discussed time line for State Lab results and that patient will be called with positive results and encouraged patient to call if she had not heard in 2 weeks.  ?Counseled to return or seek care for continued or worsening symptoms ?Recommended condom use with all sex ? ?Patient is currently using  BTL  to prevent pregnancy.  ?- Chlamydia/Gonorrhea [place] [facility] ?- WET PREP FOR [place], YEAST, CLUE ?- Chlamydia/Gonorrhea [place] [facility] ? ?2. Bacterial vaginosis ? ?- metroNIDAZOLE (FLAGYL) 500 MG tablet; Take 1 tablet (500 mg total) by mouth 2 (two) times daily for 7 days.  Dispense: 14 tablet; Refill: 0 ? ? ? ?No follow-ups on file. ? ?No future appointments. ? ?Junious Dresser, FNP ? ?

## 2021-08-24 ENCOUNTER — Telehealth: Payer: Self-pay | Admitting: Family Medicine

## 2021-08-24 NOTE — Telephone Encounter (Signed)
Please call in ref to my test results ?

## 2021-11-28 ENCOUNTER — Telehealth: Payer: Self-pay

## 2021-11-28 NOTE — Telephone Encounter (Signed)
Telephone call to patient today regarding the need to schedule an appointment for a repeat PAP and PE due August 2023.  Appointment scheduled for 12-20-2021 at 3 pm (arrival time is 2:30 pm ).  Hart Carwin, RN

## 2021-12-20 ENCOUNTER — Ambulatory Visit: Payer: Self-pay | Admitting: Nurse Practitioner

## 2021-12-20 ENCOUNTER — Encounter: Payer: Self-pay | Admitting: Nurse Practitioner

## 2021-12-20 VITALS — BP 107/72 | HR 73 | Temp 100.1°F | Wt 130.8 lb

## 2021-12-20 DIAGNOSIS — N760 Acute vaginitis: Secondary | ICD-10-CM

## 2021-12-20 DIAGNOSIS — Z113 Encounter for screening for infections with a predominantly sexual mode of transmission: Secondary | ICD-10-CM

## 2021-12-20 DIAGNOSIS — B009 Herpesviral infection, unspecified: Secondary | ICD-10-CM

## 2021-12-20 DIAGNOSIS — B9689 Other specified bacterial agents as the cause of diseases classified elsewhere: Secondary | ICD-10-CM

## 2021-12-20 MED ORDER — METRONIDAZOLE 500 MG PO TABS: 500.0000 mg | ORAL_TABLET | Freq: Two times a day (BID) | ORAL | 0 refills | Status: DC

## 2021-12-20 MED ORDER — ACYCLOVIR 400 MG PO TABS: 400.0000 mg | ORAL_TABLET | Freq: Two times a day (BID) | ORAL | 0 refills | Status: AC

## 2021-12-20 NOTE — Progress Notes (Signed)
History of Tubal Ligation. Temperature elevated. Desires STD screening. Reviewed Wet Prep results. Treated for BV: patient was dispensed Metronidazole today; provided counseling regarding the medication. We discussed the medication, the side effects and when to call clinic. Patient given the opportunity to ask questions. Questions answered.B. Venetia Constable

## 2021-12-20 NOTE — Progress Notes (Signed)
[facility] Department  STI clinic/screening visit [address] [place] [address] [phone]  Subjective:  Julie Moss is a 31 y.o. female being seen today for an STI screening visit. The patient reports they do have symptoms.  Patient reports that they do not desire a pregnancy in the next year.   They reported they are not interested in discussing contraception today.  Patient has a tubal ligation.   Patient's last menstrual period was 11/25/2021.   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 Patient reports discharge and odor that began one month ago.  Patient reports that she has a history of HSV-2 and noticed some lesions 3 days ago that are now healing. Patient has been on suppressive therapy but has been unable to refill prescription due to financial reasons.    Last HIV test per patient/review of record was 09/2020 Patient reports last pap was 11/2020.   Screening for MPX risk: Does the patient have an unexplained rash? No Is the patient MSM? No Does the patient endorse multiple sex partners or anonymous sex partners? No Did the patient have close or sexual contact with a person diagnosed with MPX? No Has the patient traveled outside the [place] where MPX is endemic? No Is there a high clinical suspicion for MPX-- evidenced by one of the following No  -Unlikely to be chickenpox  -Lymphadenopathy  -Rash that present in same phase of evolution on any given body part See flowsheet for further details and programmatic requirements.   Immunization history:   There is no immunization history on file for this patient.    The following portions of the patient's history were reviewed and updated as appropriate: allergies, current medications, past medical history, past social history, past surgical history and problem list.  Objective:   Vitals:   12/20/21 1457  BP: 107/72  Pulse: 73  Temp: 100.1 F (37.8 C)  Weight: 130 lb 12.8 oz (59.3 kg)    Physical Exam Constitutional:      Appearance: Normal appearance.  HENT:     Head: Normocephalic. No abrasion, masses or laceration. Hair is normal.     Right Ear: External ear normal.     Left Ear: External ear normal.     Nose: Nose normal.     Mouth/Throat:     Mouth: No oral lesions.     Dentition: No dental caries.     Pharynx: No oropharyngeal exudate or posterior oropharyngeal erythema.     Tonsils: No tonsillar exudate or tonsillar abscesses.  Eyes:     General: Lids are normal.        Right eye: No discharge.        Left eye: No discharge.     Conjunctiva/sclera: Conjunctivae normal.     Right eye: No exudate.    Left eye: No exudate. Abdominal:     General: Abdomen is flat.     Palpations: Abdomen is soft.     Tenderness: There is no abdominal tenderness. There is no rebound.  Genitourinary:    Pubic Area: No rash or pubic lice.  Labia:        Right: No rash, tenderness, lesion or injury.        Left: No rash, tenderness, lesion or injury.      Vagina: Normal. No vaginal discharge, erythema or lesions.     Cervix: No cervical motion tenderness, discharge, lesion or erythema.     Uterus: Not enlarged and not tender.      Rectum: Normal.     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 Healing ulcerated lesion noted to pubic area.   Musculoskeletal:     Cervical back: Full passive range of motion without pain, normal range of motion and neck supple.  Lymphadenopathy:     Cervical: No cervical  adenopathy.     Right cervical: No superficial, deep or posterior cervical adenopathy.    Left cervical: No superficial, deep or posterior cervical adenopathy.     Upper Body:     Right upper body: No supraclavicular, axillary or epitrochlear adenopathy.     Left upper body: No supraclavicular, axillary or epitrochlear adenopathy.     Lower Body: No right inguinal adenopathy. No left inguinal adenopathy.  Skin:    General: Skin is warm and dry.     Findings: No lesion or rash.  Neurological:     Mental Status: She is alert and oriented to person, place, and time.  Psychiatric:        Attention and Perception: Attention normal.        Mood and Affect: Mood normal.        Speech: Speech normal.        Behavior: Behavior normal. Behavior is cooperative.      Assessment and Plan:  Julie Moss is a 31 y.o. female presenting to the [facility] Department for STI screening  1. Screening examination for venereal disease -31 year old female in clinic today for STD screening. -Patient accepted all screenings including oral, vaginal CT/GC, wet prep, and denies bloodwork for HIV/RPR.  Patient meets criteria for HepB screening? Yes. Ordered? No - refused Patient meets criteria for HepC screening? Yes. Ordered? No - refused  Treat wet prep per standing order Discussed time line for State Lab results and that patient will be called with positive results and encouraged patient to call if she had not heard in 2 weeks.  Counseled to return or seek care for continued or worsening symptoms Recommended condom use with all sex  Patient is currently using Sterilization for [facility] and Women to prevent pregnancy.    - Chlamydia/Gonorrhea [place] [facility] - Chlamydia/Gonorrhea [place] [facility] - WET PREP FOR [place], YEAST, CLUE - metroNIDAZOLE (FLAGYL) 500 MG tablet; Take 1 tablet (500 mg total) by mouth 2 (two) times daily.  Dispense: 14 tablet; Refill: 0  2. HSV-2 infection -Due to financial  reasons.  Will provide patient today treatment for HSV-2.  Patient desires to continue with Acyclovir.   - acyclovir (ZOVIRAX) 400 MG tablet; Take 1 tablet (400 mg total) by mouth 2 (two) times daily.  Dispense: 240 tablet; Refill: 0   3. Bacterial vaginosis -Wet prep reviewed, treat patient for BV.  - metroNIDAZOLE (FLAGYL) 500 MG tablet; Take 1 tablet (500 mg total) by mouth 2 (two) times daily.  Dispense: 14 tablet; Refill: 0  Total time spent: 30 minutes   Return if symptoms worsen or fail to  improve.   Glenna Fellows, FNP

## 2021-12-21 LAB — WET PREP FOR TRICH, YEAST, CLUE
Trichomonas Exam: NEGATIVE
Yeast Exam: NEGATIVE

## 2022-06-20 ENCOUNTER — Encounter: Payer: Self-pay | Admitting: Family

## 2022-06-20 ENCOUNTER — Ambulatory Visit: Payer: Medicaid Other | Admitting: Family

## 2022-06-20 DIAGNOSIS — Z113 Encounter for screening for infections with a predominantly sexual mode of transmission: Secondary | ICD-10-CM | POA: Diagnosis not present

## 2022-06-20 DIAGNOSIS — B379 Candidiasis, unspecified: Secondary | ICD-10-CM

## 2022-06-20 LAB — WET PREP FOR TRICH, YEAST, CLUE: Trichomonas Exam: NEGATIVE

## 2022-06-20 MED ORDER — CLOTRIMAZOLE 1 % VA CREA: 1.0000 | TOPICAL_CREAM | Freq: Every day | VAGINAL | 0 refills | Status: AC

## 2022-06-20 NOTE — Progress Notes (Unsigned)
Pt is here for STD screening.  Wet mount results reviewed.  The patient was dispensed Clotrimazole Vaginal Cream #1  today. I provided counseling today regarding the medication. We discussed the medication, the side effects and when to call clinic. Patient given the opportunity to ask questions. Questions answered.  Condoms declined.  Windle Guard, RN

## 2022-06-20 NOTE — Progress Notes (Unsigned)
[facility] Department  STI clinic/screening visit [place] [address] [phone]  Subjective:  Julie Moss is a 32 y.o. female being seen today for an STI screening visit. The patient reports they do not have symptoms.  Patient reports that they do not desire a pregnancy in the next year.   They reported they are not interested in discussing contraception today.    Patient's last menstrual period was 05/24/2022 (exact date).  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 Last sex was 17 weeks ago.  Does the patient using douching products? No  Last HIV test per patient/review of record was  Lab Results  Component Value Date   HMHIVSCREEN Negative - Validated 09/23/2020   No results found for: "HIV" Patient reports last pap was No results found for: "DIAGPAP" No results found for: "SPECADGYN"  Screening for MPX risk: Does the patient have an unexplained rash? No Is the patient MSM? No Does the patient endorse multiple sex partners or anonymous sex partners? No Did the patient have close or sexual contact with a person diagnosed with MPX? No Has the patient traveled outside the [place] where MPX is endemic? No Is there a high clinical suspicion for MPX-- evidenced by one of the following No  -Unlikely to be chickenpox  -Lymphadenopathy  -Rash that present in same phase of evolution on any given body part See flowsheet for further details and programmatic requirements.    Immunization history:   There is no immunization history on file for this patient.   The following portions of the patient's history were reviewed and updated as appropriate: allergies, current medications, past medical history, past social history, past surgical history and problem list.  Objective:  There were no vitals filed for this visit.  Physical Exam Nursing note reviewed.  Constitutional:      Appearance: Normal appearance.  HENT:     Mouth/Throat:     Mouth: Mucous membranes are moist.     Pharynx: Oropharynx is clear. No oropharyngeal exudate (swab collected by provider).  Genitourinary:    Comments: Patient declined genital examination Patient chooses to self-swab for GC/CT and Wet Prep Lymphadenopathy:     Cervical: No cervical adenopathy.  Skin:    General: Skin is warm and dry.     Findings: No rash.  Neurological:     Mental Status: She is alert and oriented to person, place, and time.  Psychiatric:        Mood and Affect: Mood normal.        Behavior: Behavior normal.      Assessment and Plan:  [name] is a 32 y.o. female presenting to the [facility] Department for STI screening  1. Screening for venereal disease  - Chlamydia/Gonorrhea [place] [facility] - WET PREP FOR [place], YEAST, CLUE - Gonococcus culture   Patient accepted all screenings including oral, vaginal CT/GC, and wet prep. Declined bloodwork for HIV/RPR. Patient meets criteria for HepB screening? No. Ordered?  no Patient meets criteria for HepC screening? No. Ordered? no  Treat wet prep per standing order Discussed time line for State Lab results and that patient will be called with positive results and encouraged patient to call if she had not heard in 2 weeks.  Counseled to return or seek care for continued or worsening symptoms Recommended repeat testing in 3 months with positive results. Recommended condom use with all sex  Patient is currently using  abstinence to prevent pregnancy.    Return if symptoms worsen or fail to improve.  No future appointments.  Marline Backbone, FNP

## 2022-06-25 LAB — GONOCOCCUS CULTURE

## 2022-06-29 ENCOUNTER — Ambulatory Visit: Payer: Medicaid Other

## 2022-10-06 ENCOUNTER — Encounter: Payer: Self-pay | Admitting: Emergency Medicine

## 2022-10-06 ENCOUNTER — Emergency Department
Admission: EM | Admit: 2022-10-06 | Discharge: 2022-10-06 | Disposition: A | Discharge status: Home / Self Care | Payer: Medicaid Other | Attending: Emergency Medicine | Admitting: Emergency Medicine

## 2022-10-06 ENCOUNTER — Other Ambulatory Visit: Payer: Self-pay

## 2022-10-06 DIAGNOSIS — Z1152 Encounter for screening for COVID-19: Secondary | ICD-10-CM | POA: Diagnosis not present

## 2022-10-06 DIAGNOSIS — J029 Acute pharyngitis, unspecified: Secondary | ICD-10-CM | POA: Insufficient documentation

## 2022-10-06 DIAGNOSIS — R509 Fever, unspecified: Secondary | ICD-10-CM | POA: Diagnosis present

## 2022-10-06 DIAGNOSIS — B9689 Other specified bacterial agents as the cause of diseases classified elsewhere: Secondary | ICD-10-CM

## 2022-10-06 LAB — RESP PANEL BY RT-PCR (RSV, FLU A&B, COVID)  RVPGX2
Influenza A by PCR: NEGATIVE
Influenza B by PCR: NEGATIVE
Resp Syncytial Virus by PCR: NEGATIVE
SARS Coronavirus 2 by RT PCR: NEGATIVE

## 2022-10-06 LAB — GROUP A STREP BY PCR: Group A Strep by PCR: NOT DETECTED

## 2022-10-06 MED ORDER — AMOXICILLIN-POT CLAVULANATE 875-125 MG PO TABS: 1.0000 | ORAL_TABLET | Freq: Two times a day (BID) | ORAL | 0 refills | Status: AC

## 2022-10-06 MED ORDER — IBUPROFEN 600 MG PO TABS: 600.0000 mg | ORAL_TABLET | Freq: Three times a day (TID) | ORAL | 0 refills | Status: AC | PRN

## 2022-10-06 MED ORDER — DEXAMETHASONE 4 MG PO TABS
10.0000 mg | ORAL_TABLET | Freq: Once | ORAL | Status: AC
  Administered 2022-10-06: 10 mg via ORAL
  Filled 2022-10-06: qty 3

## 2022-10-06 NOTE — ED Provider Notes (Signed)
[facility] Provider Note    Event Date/Time   First MD Initiated Contact with Patient 10/06/22 0820     (approximate)   History   Fever and Nasal Congestion   HPI  Julie Moss is a 32 y.o. female  here with fever, nasal congestion, sore throat. Pt reports that over the past 2 days she has had aching, throbbing sore throat with fatigue. She has had chills. She has had tender lymph nodes. No known sick contacts but she does work in [employer] and has children in the home, though none of them are sick. She has pain w/ swallowing but not difficulty. She is not immunosuppressed. She has had body chills and aches with subjective fevers.       Physical Exam   Triage Vital Signs: ED Triage Vitals  Enc Vitals Group     BP 10/06/22 0809 112/76     Pulse Rate 10/06/22 0809 95     Resp 10/06/22 0809 18     Temp 10/06/22 0809 98.9 F (37.2 C)     Temp Source 10/06/22 0809 Oral     SpO2 10/06/22 0809 100 %     Weight 10/06/22 0806 138 lb (62.6 kg)     Height 10/06/22 0806 5\' 1"  (1.549 m)     Head Circumference --      Peak Flow --      Pain Score 10/06/22 0806 7     Pain Loc --      Pain Edu? --      Excl. in GC? --     Most recent vital signs: Vitals:   10/06/22 0809  BP: 112/76  Pulse: 95  Resp: 18  Temp: 98.9 F (37.2 C)  SpO2: 100%     General: Awake, no distress.  CV:  Good peripheral perfusion. RRR. Resp:  Normal work of breathing. Lungs clear to auscultation bilaterally Abd:  No distention. No tenderness Other:  Moderate posterior pharyngeal erythema including uvula though not edematous. No peritonsillar asymmetry. Neck is supple, no stridor. No signs of airway compromise.   ED Results / Procedures / Treatments   Labs (all labs ordered are listed, but only abnormal results are displayed) Labs Reviewed  RESP PANEL BY RT-PCR (RSV, FLU A&B, COVID)  [id number]  GROUP A STREP BY PCR     EKG    RADIOLOGY    I also  independently reviewed and agree with radiologist interpretations.   PROCEDURES:  Critical Care performed: No    MEDICATIONS ORDERED IN ED: Medications  dexamethasone (DECADRON) tablet 10 mg (10 mg Oral Given 10/06/22 0834)     IMPRESSION / MDM / ASSESSMENT AND PLAN / ED COURSE  I reviewed the triage vital signs and the nursing notes.                              Differential diagnosis includes, but is not limited to, pharyngitis, viral or bacterial, URI, allergic reaction/rhinitis with post nasal drainage, GERD/acid reflux  Patient's presentation is most consistent with acute presentation with potential threat to life or bodily function.  32 yo well appearing female here with sore throat. On exam, pt has moderate erythema of pharynx including uvula, swollen anterior LN, and sx concerning for strep/bacterial pharyngitis. Will tx as such empirically as pt does not have tonsils so likely low accuracy of oral swab. No signs of stridor or airway compromise. No sublingual edema. Neck  supple otherwise. Will give decadron, supportive care otherwise.     FINAL CLINICAL IMPRESSION(S) / ED DIAGNOSES   Final diagnoses:  Bacterial pharyngitis     Rx / DC Orders   ED Discharge Orders          Ordered    amoxicillin-clavulanate (AUGMENTIN) 875-125 MG tablet  2 times daily        10/06/22 0832    ibuprofen (ADVIL) 600 MG tablet  Every 8 hours PRN        10/06/22 1610             Note:  This document was prepared using Dragon voice recognition software and may include unintentional dictation errors.   Shaune Pollack, MD 10/06/22 [phone]

## 2022-10-06 NOTE — ED Triage Notes (Signed)
Pt via POV from home. Pt c/o sore throat, nasal congestion, fever, and generalized body aches for the past 3 days. Denies any sick contacts. Pt is A&OX4 and NAD

## 2022-11-05 ENCOUNTER — Telehealth: Payer: Self-pay | Admitting: Family Medicine

## 2022-11-05 ENCOUNTER — Other Ambulatory Visit: Payer: Self-pay | Admitting: Family Medicine

## 2022-11-05 DIAGNOSIS — B009 Herpesviral infection, unspecified: Secondary | ICD-10-CM

## 2022-11-05 MED ORDER — ACYCLOVIR 800 MG PO TABS
800.0000 mg | ORAL_TABLET | Freq: Every day | ORAL | 12 refills | Status: AC
Start: 2022-11-05 — End: ?

## 2022-11-05 NOTE — Telephone Encounter (Signed)
Pt would like an Rx for Acyclovir sent to [facility] in [place].  Pt notified that the Provider would send the Rx and she would be able to pick it up this afternoon. Berdie Ogren, RN

## 2022-11-05 NOTE — Telephone Encounter (Signed)
Patient called with questions about a prescription.

## 2023-05-29 ENCOUNTER — Encounter: Payer: Self-pay | Admitting: Advanced Practice Midwife

## 2023-05-29 ENCOUNTER — Ambulatory Visit: Payer: Medicaid Other

## 2023-05-29 DIAGNOSIS — Z124 Encounter for screening for malignant neoplasm of cervix: Secondary | ICD-10-CM | POA: Insufficient documentation

## 2023-05-29 DIAGNOSIS — R87612 Low grade squamous intraepithelial lesion on cytologic smear of cervix (LGSIL): Secondary | ICD-10-CM

## 2023-05-29 DIAGNOSIS — Z113 Encounter for screening for infections with a predominantly sexual mode of transmission: Secondary | ICD-10-CM

## 2023-05-29 DIAGNOSIS — B9689 Other specified bacterial agents as the cause of diseases classified elsewhere: Secondary | ICD-10-CM

## 2023-05-29 LAB — WET PREP FOR TRICH, YEAST, CLUE
Trichomonas Exam: NEGATIVE
Yeast Exam: NEGATIVE

## 2023-05-29 MED ORDER — METRONIDAZOLE 500 MG PO TABS
500.0000 mg | ORAL_TABLET | Freq: Two times a day (BID) | ORAL | Status: AC
Start: 2023-05-29 — End: 2023-06-05

## 2023-05-29 NOTE — Assessment & Plan Note (Signed)
Based on current ASCCP guidelines, is due for PAP smear today.

## 2023-05-29 NOTE — Progress Notes (Unsigned)
Pt is here for STD screening.  Wet mount results reviewed.  The patient was dispensed Metronidazole 500 mg #14 today. I provided counseling today regarding the medication. We discussed the medication, the side effects and when to call clinic. Patient given the opportunity to ask questions. Questions answered.  Condoms declined.  Berdie Ogren, RN

## 2023-05-29 NOTE — Progress Notes (Unsigned)
[facility] Department STI clinic [address], [address] [place] [address] Main phone: [phone]  STI screening visit  Subjective:  Julie Moss is a 33 y.o. SWF nonsmoker G4P3 female being seen today for an STI screening visit. The patient reports they do have symptoms.  Patient reports that they do not desire a pregnancy in the next year.   They reported they are not interested in discussing contraception today.    Patient's last menstrual period was 05/29/2023 (exact date).  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 BTL 2017. LMP today. Last sex 05/25/23 without condom; with current partner off and on x 7 years; 1 partner in last 3 months. Last MJ 3 days ago. Last ETOH mid December (3 shots liquor).  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    Does the patient using douching products? No  Last HIV test per patient/review of record was  Lab Results  Component Value Date   HMHIVSCREEN Negative - Validated 09/23/2020   No results found for: "HIV"   Last HEPC test per patient/review of record was  Lab Results  Component Value Date   HMHEPCSCREEN Negative-Validated 07/01/2019   No components found for: "HEPC"   Last HEPB test per patient/review of record was No components found for: "HMHEPBSCREEN" No components found for: "HEPC"   Patient reports last pap was: 11/16/2020; colpo 12/13/20 CIN 1  No results found for: "DIAGPAP", "HPVHIGH", "ADEQPAP" No results  found for: "SPECADGYN" Result Date Procedure Results Follow-ups  12/13/2020 Surgical pathology SURGICAL PATHOLOGY: SURGICAL PATHOLOGY CASE: [id number] PATIENT: Julie Moss Surgical Pathology Report     Clinical History: LGSIL, high risk HPV (cm)     FINAL MICROSCOPIC DIAGNOSIS:  A. CERVIX, 1 O'CLOCK, BIOPSY: - Low-grade squamous intraepithelia...   11/16/2020 IGP, Aptima HPV Interpretation: EPCA,CHVIRB (A) HPV Aptima: Positive (A) Category: LSIL (A) Adequacy: ENDO Clinician Provided ICD10: Comment Performed by:: Comment Electronically signed by:: Comment PATHOLOGIST PROVIDED ICD10:: Comment Note:: Comment Test Methodology: Comment   07/22/2019 IGP, Aptima HPV Interpretation: EPCA,ASUSB (A) Test Methodology: Comment HPV Aptima: Negative Category: ASC-US (A) Adequacy: SECNI Clinician Provided ICD10: Comment Performed by:: Comment QC reviewed by:: Comment Electronically signed by:: Comment PATHOLOGIST PROVIDED ICD10:: Comment Note:: Comment   11/01/2014 Cytology - PAP Pap Smear: ASC-US HPV: HRHPV +   07/28/2012 Cytology - PAP Pap Smear: LSIL     Screening for MPX risk: Does the patient have an unexplained rash? No Is the patient MSM? No Does the patient endorse multiple sex partners or anonymous sex partners? No Did the patient have close or sexual contact with a person diagnosed with MPX? No Has the patient traveled outside the [place] where MPX is endemic? No Is there a high clinical suspicion for MPX-- evidenced by one of the following No  -Unlikely to be chickenpox  -Lymphadenopathy  -Rash that present in same phase of evolution on any given body part See flowsheet for further details and programmatic requirements.  Immunization history:   There is no immunization history on file for this patient.   The following portions of the patient's history were reviewed and updated as appropriate: allergies, current medications, past medical history, past social  history, past surgical history and problem list.  Objective:  There were no vitals filed for this visit.  Physical Exam Vitals and nursing note reviewed.  Constitutional:      Appearance: Normal appearance. She is normal weight.  HENT:     Head: Normocephalic and atraumatic.     Mouth/Throat:     Mouth: Mucous membranes are moist.     Pharynx: Oropharynx is clear. No oropharyngeal exudate or posterior oropharyngeal erythema.  Eyes:     Conjunctiva/sclera: Conjunctivae normal.  Neck:     Thyroid: No thyroid mass, thyromegaly or thyroid tenderness.  Pulmonary:     Effort: Pulmonary effort is normal.  Abdominal:     General: Abdomen is flat.     Palpations: Abdomen is soft. There is no mass.     Tenderness: There is no abdominal tenderness. There is no rebound.     Comments: Soft without masses or tenderness  Genitourinary:    General: Normal vulva.     Exam position: Lithotomy position.     Pubic Area: No rash or pubic lice.      Labia:        Right: No rash or lesion.        Left: No rash or lesion.      Vagina: Bleeding (red menses blood, ph>4.5) present. No vaginal discharge, erythema or lesions.     Cervix: Normal. No cervical motion tenderness, discharge, friability, lesion or erythema.     Uterus: Normal.      Rectum: Normal.     Comments: pH = >4.5 Pt declines chaperone for exam Lymphadenopathy:     Head:     Right side of head: No preauricular or posterior auricular adenopathy.     Left side of head: No preauricular or posterior auricular adenopathy.     Cervical: No cervical adenopathy.     Upper Body:     Right upper body: No supraclavicular, axillary or epitrochlear adenopathy.     Left upper body: No supraclavicular, axillary or epitrochlear adenopathy.     Lower Body: No right inguinal adenopathy. No left inguinal adenopathy.  Skin:    General: Skin is warm and dry.     Findings: No rash.  Neurological:     Mental Status: She is alert and oriented to  person, place, and time.     Assessment and Plan:  Julie Moss is a 33 y.o. female presenting to the [facility] Department for STI screening  1. Screening examination for venereal disease (Primary) Please treat pt for BV per standing orders Immunization nurse consult Pt counseled if all tests neg will need to return for rescreening after menses completed  - Chlamydia/Gonorrhea [place] - Syphilis Serology, [place] [facility] - HIV [place] [facility] - WET PREP FOR TRICH, YEAST, CLUE - Gonococcus culture    Patient accepted all screenings including oral, vaginal CT/GC and bloodwork for HIV/RPR, and wet prep. Patient meets criteria for HepB screening? Yes. Ordered? Pt declines Patient meets criteria for HepC screening? Yes. Ordered? Pt declines bloodwork  Treat wet prep per standing order Discussed time line for [facility] results and that patient will be called with positive results and encouraged patient to call if she had not heard in 2 weeks.  Counseled to return or seek care for continued or worsening symptoms Recommended repeat testing in 3 months with positive results. Recommended condom use with all sex for STI prevention.   Patient is currently using [facility] to prevent pregnancy.    Return if symptoms worsen or fail to improve.  No future appointments.  Alberteen Spindle, CNM

## 2023-06-04 LAB — GONOCOCCUS CULTURE

## 2023-06-10 ENCOUNTER — Telehealth: Payer: Self-pay

## 2023-06-10 NOTE — Telephone Encounter (Signed)
PW verified.  Pt notified of positive Chlamydia results.  Treatment appointment made for 2/4 @ 11:15 am.  Berdie Ogren, RN

## 2023-06-11 ENCOUNTER — Ambulatory Visit: Payer: Medicaid Other

## 2023-06-11 DIAGNOSIS — A749 Chlamydial infection, unspecified: Secondary | ICD-10-CM

## 2023-06-11 MED ORDER — DOXYCYCLINE HYCLATE 100 MG PO TABS
100.0000 mg | ORAL_TABLET | Freq: Two times a day (BID) | ORAL | Status: AC
Start: 2023-06-11 — End: 2023-06-18

## 2023-06-11 NOTE — Progress Notes (Signed)
 Pt is here for Chlamydia treatment.  The patient was dispensed Doxycycline 100 mg #14 today. I provided counseling today regarding the medication. We discussed the medication, the side effects and when to call clinic. Patient given the opportunity to ask questions. Questions answered.  Condoms given.  Berdie Ogren, RN

## 2023-07-26 ENCOUNTER — Ambulatory Visit (LOCAL_COMMUNITY_HEALTH_CENTER)

## 2023-07-26 DIAGNOSIS — Z111 Encounter for screening for respiratory tuberculosis: Secondary | ICD-10-CM

## 2023-07-29 ENCOUNTER — Ambulatory Visit (LOCAL_COMMUNITY_HEALTH_CENTER)

## 2023-07-29 DIAGNOSIS — Z111 Encounter for screening for respiratory tuberculosis: Secondary | ICD-10-CM

## 2023-07-29 LAB — TB SKIN TEST
Induration: 0 mm
TB Skin Test: NEGATIVE

## 2023-12-12 ENCOUNTER — Other Ambulatory Visit: Payer: Self-pay | Admitting: Family Medicine

## 2023-12-12 DIAGNOSIS — B009 Herpesviral infection, unspecified: Secondary | ICD-10-CM
